# Patient Record
Sex: Female | Born: 1980 | Race: Black or African American | Hispanic: No | Marital: Married | Smoking: Never smoker
Health system: Southern US, Community
[De-identification: ages and names within clinical notes are randomized; demographics above are authoritative.]

## PROBLEM LIST (undated history)

## (undated) ENCOUNTER — Inpatient Hospital Stay (HOSPITAL_COMMUNITY): Payer: Self-pay

## (undated) DIAGNOSIS — A6000 Herpesviral infection of urogenital system, unspecified: Secondary | ICD-10-CM

## (undated) DIAGNOSIS — Z87442 Personal history of urinary calculi: Secondary | ICD-10-CM

## (undated) DIAGNOSIS — I10 Essential (primary) hypertension: Secondary | ICD-10-CM

## (undated) DIAGNOSIS — E119 Type 2 diabetes mellitus without complications: Secondary | ICD-10-CM

## (undated) DIAGNOSIS — M9251 Juvenile osteochondrosis of tibia and fibula, right leg: Secondary | ICD-10-CM

## (undated) DIAGNOSIS — G43909 Migraine, unspecified, not intractable, without status migrainosus: Secondary | ICD-10-CM

## (undated) DIAGNOSIS — M92523 Juvenile osteochondrosis of tibia tubercle, bilateral: Secondary | ICD-10-CM

## (undated) DIAGNOSIS — M9252 Juvenile osteochondrosis of tibia and fibula, left leg: Secondary | ICD-10-CM

## (undated) HISTORY — PX: TONSILLECTOMY: SUR1361

## (undated) HISTORY — DX: Type 2 diabetes mellitus without complications: E11.9

## (undated) HISTORY — PX: WISDOM TOOTH EXTRACTION: SHX21

## (undated) HISTORY — PX: ADENOIDECTOMY: SUR15

---
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
## 2013-02-17 NOTE — L&D Delivery Note (Addendum)
0410: Patient complaining of increased rectal pressure and urge to push.  FHR remained Cat II, however, Dr. Dorris CarnesN. Dillard on standby in building.  Patient instructed on pushing techniques and delivered as below. Delivery call made when infant at +5 station and Dr. Mellody Memos. Davanzo and team present for MSAF.  Delivery Note At 4:21 AM a viable female "[name]" was delivered via Vaginal, Spontaneous Delivery (Presentation: Direct Occiput Anterior).  Upon delivery of head, double nuchal cord noted and infant delivered through via somersault maneuver. Infant with spontaneous cry, but not stimulated due to MSAF.  Immediately placed upon mothers abdomen and cord cut, while RN provided bulb suction.  Infant to warmer for assessment by Dr. Mellody Memos. Davanzo and APGARs scored as: 8, 9; weight 7lbs 4oz.  Cord blood collected and placenta delivered spontaneously and noted to be intact with 3VC upon inspection.   Fundus noted to be boggy to firm with massage, but with constant trickle noted.  Vaginal inspection noted trailing membranes which was collected with rings forcep. However, moderate amount of bleeding noted despite firm fundus.  Uterine exploration performed and yielded ~23850mL of blood clots.  Bleeding scant and infant StS with mother upon provider exit. Ancef ordered for infection prophylaxis.   Anesthesia: Epidural  Episiotomy: None Lacerations: 1st degree;Vaginal;Periurethral Suture Repair: 3.0 vicryl Est. Blood Loss (mL): 450  Mom to postpartum.  Baby to Couplet care / Skin to Skin.  Maleak Brazzel LYNN, CNM, MSN 09/22/2013, 5:18 AM

## 2013-06-06 ENCOUNTER — Other Ambulatory Visit: Payer: Self-pay | Admitting: Obstetrics & Gynecology

## 2013-06-06 DIAGNOSIS — N644 Mastodynia: Secondary | ICD-10-CM

## 2013-06-13 ENCOUNTER — Ambulatory Visit
Admission: RE | Admit: 2013-06-13 | Discharge: 2013-06-13 | Disposition: A | Discharge status: Home / Self Care | Payer: BC Managed Care – PPO | Source: Ambulatory Visit | Attending: Obstetrics & Gynecology | Admitting: Obstetrics & Gynecology

## 2013-06-13 ENCOUNTER — Encounter (INDEPENDENT_AMBULATORY_CARE_PROVIDER_SITE_OTHER): Payer: Self-pay

## 2013-06-13 ENCOUNTER — Other Ambulatory Visit: Payer: Self-pay

## 2013-06-13 DIAGNOSIS — N644 Mastodynia: Secondary | ICD-10-CM

## 2013-09-01 ENCOUNTER — Encounter (HOSPITAL_COMMUNITY): Payer: Self-pay | Admitting: *Deleted

## 2013-09-01 ENCOUNTER — Other Ambulatory Visit: Payer: Self-pay | Admitting: Obstetrics and Gynecology

## 2013-09-01 ENCOUNTER — Inpatient Hospital Stay (HOSPITAL_COMMUNITY)
Admission: AD | Admit: 2013-09-01 | Discharge: 2013-09-01 | Disposition: A | Discharge status: Home / Self Care | Payer: BC Managed Care – PPO | Source: Ambulatory Visit | Attending: Obstetrics and Gynecology | Admitting: Obstetrics and Gynecology

## 2013-09-01 DIAGNOSIS — O9989 Other specified diseases and conditions complicating pregnancy, childbirth and the puerperium: Principal | ICD-10-CM

## 2013-09-01 DIAGNOSIS — R030 Elevated blood-pressure reading, without diagnosis of hypertension: Secondary | ICD-10-CM | POA: Insufficient documentation

## 2013-09-01 DIAGNOSIS — IMO0002 Reserved for concepts with insufficient information to code with codable children: Secondary | ICD-10-CM | POA: Insufficient documentation

## 2013-09-01 DIAGNOSIS — O98519 Other viral diseases complicating pregnancy, unspecified trimester: Secondary | ICD-10-CM | POA: Insufficient documentation

## 2013-09-01 DIAGNOSIS — O99891 Other specified diseases and conditions complicating pregnancy: Secondary | ICD-10-CM | POA: Insufficient documentation

## 2013-09-01 DIAGNOSIS — A6000 Herpesviral infection of urogenital system, unspecified: Secondary | ICD-10-CM | POA: Insufficient documentation

## 2013-09-01 LAB — URINALYSIS, ROUTINE W REFLEX MICROSCOPIC
Bilirubin Urine: NEGATIVE
GLUCOSE, UA: NEGATIVE mg/dL
Hgb urine dipstick: NEGATIVE
Ketones, ur: NEGATIVE mg/dL
Leukocytes, UA: NEGATIVE
Nitrite: NEGATIVE
PH: 6 (ref 5.0–8.0)
Protein, ur: NEGATIVE mg/dL
Specific Gravity, Urine: 1.01 (ref 1.005–1.030)
Urobilinogen, UA: 0.2 mg/dL (ref 0.0–1.0)

## 2013-09-01 LAB — COMPREHENSIVE METABOLIC PANEL
ALK PHOS: 111 U/L (ref 39–117)
ALT: 8 U/L (ref 0–35)
ANION GAP: 12 (ref 5–15)
AST: 11 U/L (ref 0–37)
Albumin: 2.5 g/dL — ABNORMAL LOW (ref 3.5–5.2)
BUN: 6 mg/dL (ref 6–23)
CHLORIDE: 101 meq/L (ref 96–112)
CO2: 23 meq/L (ref 19–32)
Calcium: 8.7 mg/dL (ref 8.4–10.5)
Creatinine, Ser: 0.76 mg/dL (ref 0.50–1.10)
GFR calc Af Amer: >90 mL/min (ref >90)
GLUCOSE: 78 mg/dL (ref 70–99)
POTASSIUM: 4 meq/L (ref 3.7–5.3)
SODIUM: 136 meq/L — AB (ref 137–147)
TOTAL PROTEIN: 5.9 g/dL — AB (ref 6.0–8.3)

## 2013-09-01 LAB — CBC
HEMATOCRIT: 32.1 % — AB (ref 36.0–46.0)
HEMOGLOBIN: 10.1 g/dL — AB (ref 12.0–15.0)
MCH: 26.2 pg (ref 26.0–34.0)
MCHC: 31.5 g/dL (ref 30.0–36.0)
MCV: 83.4 fL (ref 78.0–100.0)
Platelets: 186 10*3/uL (ref 150–400)
RBC: 3.85 MIL/uL — ABNORMAL LOW (ref 3.87–5.11)
RDW: 13.5 % (ref 11.5–15.5)
WBC: 7.4 10*3/uL (ref 4.0–10.5)

## 2013-09-01 LAB — PROTEIN / CREATININE RATIO, URINE
CREATININE, URINE: 94.59 mg/dL
PROTEIN CREATININE RATIO: 0.08 (ref 0.00–0.15)
TOTAL PROTEIN, URINE: 7.2 mg/dL

## 2013-09-01 LAB — LACTATE DEHYDROGENASE: LDH: 199 U/L (ref 94–250)

## 2013-09-01 LAB — URIC ACID: Uric Acid, Serum: 6.4 mg/dL (ref 2.4–7.0)

## 2013-09-01 MED ORDER — VALACYCLOVIR HCL 500 MG PO TABS
500.0000 mg | ORAL_TABLET | Freq: Two times a day (BID) | ORAL | Status: DC
Start: 2013-09-01 — End: 2013-09-24

## 2013-09-01 MED ORDER — VITAFOL-OB PO TABS: 1.0000 | ORAL_TABLET | Freq: Every day | ORAL | Status: DC

## 2013-09-01 NOTE — MAU Note (Addendum)
Pt sent over for PIH eval. Elevated b/p and swelling in legs.

## 2013-09-01 NOTE — Discharge Instructions (Signed)
Back Pain in Pregnancy °Back pain during pregnancy is common. It happens in about half of all pregnancies. It is important for you and your baby that you remain active during your pregnancy. If you feel that back pain is not allowing you to remain active or sleep well, it is time to see your caregiver. Back pain may be caused by several factors related to changes during your pregnancy. Fortunately, unless you had trouble with your back before your pregnancy, the pain is likely to get better after you deliver. °Low back pain usually occurs between the fifth and seventh months of pregnancy. It can, however, happen in the first couple months. Factors that increase the risk of back problems include:  °· Previous back problems. °· Injury to your back. °· Having twins or multiple births. °· A chronic cough. °· Stress. °· Job-related repetitive motions. °· Muscle or spinal disease in the back. °· Family history of back problems, ruptured (herniated) discs, or osteoporosis. °· Depression, anxiety, and panic attacks. °CAUSES  °· When you are pregnant, your body produces a hormone called relaxin. This hormone makes the ligaments connecting the low back and pubic bones more flexible. This flexibility allows the baby to be delivered more easily. When your ligaments are loose, your muscles need to work harder to support your back. Soreness in your back can come from tired muscles. Soreness can also come from back tissues that are irritated since they are receiving less support. °· As the baby grows, it puts pressure on the nerves and blood vessels in your pelvis. This can cause back pain. °· As the baby grows and gets heavier during pregnancy, the uterus pushes the stomach muscles forward and changes your center of gravity. This makes your back muscles work harder to maintain good posture. °SYMPTOMS  °Lumbar pain during pregnancy °Lumbar pain during pregnancy usually occurs at or above the waist in the center of the back. There  may be pain and numbness that radiates into your leg or foot. This is similar to low back pain experienced by non-pregnant women. It usually increases with sitting for long periods of time, standing, or repetitive lifting. Tenderness may also be present in the muscles along your upper back. °Posterior pelvic pain during pregnancy °Pain in the back of the pelvis is more common than lumbar pain in pregnancy. It is a deep pain felt in your side at the waistline, or across the tailbone (sacrum), or in both places. You may have pain on one or both sides. This pain can also go into the buttocks and backs of the upper thighs. Pubic and groin pain may also be present. The pain does not quickly resolve with rest, and morning stiffness may also be present. °Pelvic pain during pregnancy can be brought on by most activities. A high level of fitness before and during pregnancy may or may not prevent this problem. Labor pain is usually 1 to 2 minutes apart, lasts for about 1 minute, and involves a bearing down feeling or pressure in your pelvis. However, if you are at term with the pregnancy, constant low back pain can be the beginning of early labor, and you should be aware of this. °DIAGNOSIS  °X-rays of the back should not be done during the first 12 to 14 weeks of the pregnancy and only when absolutely necessary during the rest of the pregnancy. MRIs do not give off radiation and are safe during pregnancy. MRIs also should only be done when absolutely necessary. °HOME CARE INSTRUCTIONS °· Exercise   as directed by your caregiver. Exercise is the most effective way to prevent or manage back pain. If you have a back problem, it is especially important to avoid sports that require sudden body movements. Swimming and walking are great activities. °· Do not stand in one place for long periods of time. °· Do not wear high heels. °· Sit in chairs with good posture. Use a pillow on your lower back if necessary. Make sure your head  rests over your shoulders and is not hanging forward. °· Try sleeping on your side, preferably the left side, with a pillow or two between your legs. If you are sore after a night's rest, your bed may be too soft. Try placing a board between your mattress and box spring. °· Listen to your body when lifting. If you are experiencing pain, ask for help or try bending your knees more so you can use your leg muscles rather than your back muscles. Squat down when picking up something from the floor. Do not bend over. °· Eat a healthy diet. Try to gain weight within your caregiver's recommendations. °· Use heat or cold packs 3 to 4 times a day for 15 minutes to help with the pain. °· Only take over-the-counter or prescription medicines for pain, discomfort, or fever as directed by your caregiver. °Sudden (acute) back pain °· Use bed rest for only the most extreme, acute episodes of back pain. Prolonged bed rest over 48 hours will aggravate your condition. °· Ice is very effective for acute conditions. °¨ Put ice in a plastic bag. °¨ Place a towel between your skin and the bag. °¨ Leave the ice on for 10 to 20 minutes every 2 hours, or as needed. °· Using heat packs for 30 minutes prior to activities is also helpful. °Continued back pain °See your caregiver if you have continued problems. Your caregiver can help or refer you for appropriate physical therapy. With conditioning, most back problems can be avoided. Sometimes, a more serious issue may be the cause of back pain. You should be seen right away if new problems seem to be developing. Your caregiver may recommend: °· A maternity girdle. °· An elastic sling. °· A back brace. °· A massage therapist or acupuncture. °SEEK MEDICAL CARE IF:  °· You are not able to do most of your daily activities, even when taking the pain medicine you were given. °· You need a referral to a physical therapist or chiropractor. °· You want to try acupuncture. °SEEK IMMEDIATE MEDICAL CARE  IF: °· You develop numbness, tingling, weakness, or problems with the use of your arms or legs. °· You develop severe back pain that is no longer relieved with medicines. °· You have a sudden change in bowel or bladder control. °· You have increasing pain in other areas of the body. °· You develop shortness of breath, dizziness, or fainting. °· You develop nausea, vomiting, or sweating. °· You have back pain which is similar to labor pains. °· You have back pain along with your water breaking or vaginal bleeding. °· You have back pain or numbness that travels down your leg. °· Your back pain developed after you fell. °· You develop pain on one side of your back. You may have a kidney stone. °· You see blood in your urine. You may have a bladder infection or kidney stone. °· You have back pain with blisters. You may have shingles. °Back pain is fairly common during pregnancy but should not be accepted as just part of   the process. Back pain should always be treated as soon as possible. This will make your pregnancy as pleasant as possible. Document Released: 05/14/2005 Document Revised: 04/28/2011 Document Reviewed: 06/25/2010 [facility] Patient Information 2015 [place], [place]. This information is not intended to replace advice given to you by your health care provider. Make sure you discuss any questions you have with your health care provider. Abdominal Pain During Pregnancy Abdominal pain is common in pregnancy. Most of the time, it does not cause harm. There are many causes of abdominal pain. Some causes are more serious than others. Some of the causes of abdominal pain in pregnancy are easily diagnosed. Occasionally, the diagnosis takes time to understand. Other times, the cause is not determined. Abdominal pain can be a sign that something is very wrong with the pregnancy, or the pain may have nothing to do with the pregnancy at all. For this reason, always tell your health care provider if you have any  abdominal discomfort. HOME CARE INSTRUCTIONS  Monitor your abdominal pain for any changes. The following actions may help to alleviate any discomfort you are experiencing:  Do not have sexual intercourse or put anything in your vagina until your symptoms go away completely.  Get plenty of rest until your pain improves.  Drink clear fluids if you feel nauseous. Avoid solid food as long as you are uncomfortable or nauseous.  Only take over-the-counter or prescription medicine as directed by your health care provider.  Keep all follow-up appointments with your health care provider. SEEK IMMEDIATE MEDICAL CARE IF:  You are bleeding, leaking fluid, or passing tissue from the vagina.  You have increasing pain or cramping.  You have persistent vomiting.  You have painful or bloody urination.  You have a fever.  You notice a decrease in your baby's movements.  You have extreme weakness or feel faint.  You have shortness of breath, with or without abdominal pain.  You develop a severe headache with abdominal pain.  You have abnormal vaginal discharge with abdominal pain.  You have persistent diarrhea.  You have abdominal pain that continues even after rest, or gets worse. MAKE SURE YOU:   Understand these instructions.  Will watch your condition.  Will get help right away if you are not doing well or get worse. Document Released: 02/03/2005 Document Revised: 11/24/2012 Document Reviewed: 09/02/2012 [facility] Patient Information 2015 [place], [place]. This information is not intended to replace advice given to you by your health care provider. Make sure you discuss any questions you have with your health care provider.

## 2013-09-01 NOTE — MAU Provider Note (Signed)
History   Patient is a 33 y.o. G1P0 who presents from the office after a routine gyn appt, in which she was discovered to be 37.[redacted]wks pregnant.  Patient states that she has been having abdominal and back pain which she has been contributing to gas.  Patient states that she was seen at another office earlier this month and was told that her pains was related to her increased weight.  Patient states that she has been feeling "gas" for the last two weeks, which she now states is fetal movement.  Patient denies LoF, VB, and cramping/contractions.  Patient sent from office due to increased blood pressure, which she contributes to recent discovery of pregnancy.  Patient denies headache, visual disturbance, numbness/tingling, epigastric pain, and SOB.  Patient reports some leg edema, but states this just started about 2 weeks ago.  Patient denies personal history of blood pressure related issues.   There are no active problems to display for this patient.   Chief Complaint  Patient presents with  . Hypertension   HPI  OB History   Grav Para Term Preterm Abortions TAB SAB Ect Mult Living   2 0   1     0      Past Medical History  Diagnosis Date  . Medical history non-contributory     Past Surgical History  Procedure Laterality Date  . Tonsillectomy    . Adenoidectomy      History reviewed. No pertinent family history.  History  Substance Use Topics  . Smoking status: Never Smoker   . Smokeless tobacco: Not on file  . Alcohol Use: Yes     Comment: ocassional    Allergies: No Known Allergies  Prescriptions prior to admission  Medication Sig Dispense Refill  . aspirin-acetaminophen-caffeine (EXCEDRIN MIGRAINE) 250-250-65 MG per tablet Take 2 tablets by mouth every 6 (six) hours as needed for headache.        ROS  See HPI Above Physical Exam   Blood pressure 129/84, pulse 66, resp. rate 18, last menstrual period 12/14/2012. Filed Vitals:   09/01/13 1919 09/01/13 1933 09/01/13  2014 09/01/13 2018  BP: 129/84 141/80 154/85 135/85  Pulse: 66 66 64 62  Resp:         Results for orders placed during the hospital encounter of 09/01/13 (from the past 24 hour(s))  URINALYSIS, ROUTINE W REFLEX MICROSCOPIC     Status: None   Collection Time    09/01/13  6:25 PM      Result Value Ref Range   Color, Urine YELLOW  YELLOW   APPearance CLEAR  CLEAR   Specific Gravity, Urine 1.010  1.005 - 1.030   pH 6.0  5.0 - 8.0   Glucose, UA NEGATIVE  NEGATIVE mg/dL   Hgb urine dipstick NEGATIVE  NEGATIVE   Bilirubin Urine NEGATIVE  NEGATIVE   Ketones, ur NEGATIVE  NEGATIVE mg/dL   Protein, ur NEGATIVE  NEGATIVE mg/dL   Urobilinogen, UA 0.2  0.0 - 1.0 mg/dL   Nitrite NEGATIVE  NEGATIVE   Leukocytes, UA NEGATIVE  NEGATIVE  PROTEIN / CREATININE RATIO, URINE     Status: None   Collection Time    09/01/13  6:25 PM      Result Value Ref Range   Creatinine, Urine 94.59     Total Protein, Urine 7.2     PROTEIN CREATININE RATIO 0.08  0.00 - 0.15  CBC     Status: Abnormal   Collection Time    09/01/13  7:05 PM      Result Value Ref Range   WBC 7.4  4.0 - 10.5 K/uL   RBC 3.85 (*) 3.87 - 5.11 MIL/uL   Hemoglobin 10.1 (*) 12.0 - 15.0 g/dL   HCT 16.1 (*) 09.6 - 04.5 %   MCV 83.4  78.0 - 100.0 fL   MCH 26.2  26.0 - 34.0 pg   MCHC 31.5  30.0 - 36.0 g/dL   RDW 40.9  81.1 - 91.4 %   Platelets 186  150 - 400 K/uL  COMPREHENSIVE METABOLIC PANEL     Status: Abnormal   Collection Time    09/01/13  7:05 PM      Result Value Ref Range   Sodium 136 (*) 137 - 147 mEq/L   Potassium 4.0  3.7 - 5.3 mEq/L   Chloride 101  96 - 112 mEq/L   CO2 23  19 - 32 mEq/L   Glucose, Bld 78  70 - 99 mg/dL   BUN 6  6 - 23 mg/dL   Creatinine, Ser 7.82  0.50 - 1.10 mg/dL   Calcium 8.7  8.4 - 95.6 mg/dL   Total Protein 5.9 (*) 6.0 - 8.3 g/dL   Albumin 2.5 (*) 3.5 - 5.2 g/dL   AST 11  0 - 37 U/L   ALT 8  0 - 35 U/L   Alkaline Phosphatase 111  39 - 117 U/L   Total Bilirubin <0.2 (*) 0.3 - 1.2 mg/dL   GFR  calc non Af Amer >90  >90 mL/min   GFR calc Af Amer >90  >90 mL/min   Anion gap 12  5 - 15  LACTATE DEHYDROGENASE     Status: None   Collection Time    09/01/13  7:05 PM      Result Value Ref Range   LDH 199  94 - 250 U/L  URIC ACID     Status: None   Collection Time    09/01/13  7:05 PM      Result Value Ref Range   Uric Acid, Serum 6.4  2.4 - 7.0 mg/dL    Physical Exam  Constitutional: She is oriented to person, place, and time. She appears well-developed and well-nourished. No distress.  +2 Bilateral Pitting Edema in LE Some facial edema noted, particularly in eyelids.  Cardiovascular: Normal rate, regular rhythm and normal heart sounds.   Respiratory: Effort normal and breath sounds normal.  GI: Soft. Bowel sounds are normal. There is no tenderness. There is no guarding and no CVA tenderness.  Neurological: She is alert and oriented to person, place, and time.  Skin: Skin is warm and dry.  Psychiatric: She has a normal mood and affect. Her speech is normal and behavior is normal. Thought content normal.  Appears to be coping well with discovery of pregnancy in 3rd trimester.    FHR: 125 bpm, Mod Var, -Decels, +Accels UC: None noted or palpated--Fundus soft, NT ED Course  Assessment: IUP at 37.2wks Elevated BP Edema H/O HSV  Plan: -PIH labs ordered-results pending -Will add ob labs, HbSAG, RPR, and HgB electrophoresis, to current orders to avoid future needlesticks -B/P Q  Follow Up (2020) -Labs normal -BP Stable -Discussed POC regarding PNC-Appt scheduled for 7/21 -Will start Valtrex prophylaxis-Rx sent -Will start PNV-RX sent -Discontinue usage of excedrin -Discussed who and when to call for pregnancy related concerns/issues -Call if you have any questions or concerns prior to your next visit.   Lovie Agresta LYNN CNM, MSN  09/01/2013 8:09 PM

## 2013-09-02 LAB — HEPATITIS B SURFACE ANTIGEN: HEP B S AG: NEGATIVE

## 2013-09-02 LAB — SICKLE CELL SCREEN: Sickle Cell Screen: NEGATIVE

## 2013-09-02 LAB — RPR

## 2013-09-05 LAB — OB RESULTS CONSOLE GC/CHLAMYDIA
CHLAMYDIA, DNA PROBE: NEGATIVE
Gonorrhea: NEGATIVE

## 2013-09-05 LAB — OB RESULTS CONSOLE ABO/RH: RH Type: POSITIVE

## 2013-09-05 LAB — OB RESULTS CONSOLE ANTIBODY SCREEN: ANTIBODY SCREEN: NEGATIVE

## 2013-09-05 LAB — OB RESULTS CONSOLE RPR: RPR: NONREACTIVE

## 2013-09-05 LAB — OB RESULTS CONSOLE GBS: STREP GROUP B AG: NEGATIVE

## 2013-09-05 LAB — OB RESULTS CONSOLE HIV ANTIBODY (ROUTINE TESTING): HIV: NONREACTIVE

## 2013-09-05 LAB — OB RESULTS CONSOLE RUBELLA ANTIBODY, IGM: Rubella: NON-IMMUNE/NOT IMMUNE

## 2013-09-12 ENCOUNTER — Institutional Professional Consult (permissible substitution): Payer: Self-pay | Admitting: Pediatrics

## 2013-09-14 ENCOUNTER — Telehealth (HOSPITAL_COMMUNITY): Payer: Self-pay | Admitting: *Deleted

## 2013-09-14 NOTE — Telephone Encounter (Signed)
Preadmission screen  

## 2013-09-16 ENCOUNTER — Encounter (HOSPITAL_COMMUNITY): Payer: Self-pay | Admitting: Obstetrics and Gynecology

## 2013-09-16 DIAGNOSIS — O139 Gestational [pregnancy-induced] hypertension without significant proteinuria, unspecified trimester: Secondary | ICD-10-CM | POA: Diagnosis present

## 2013-09-16 DIAGNOSIS — O0933 Supervision of pregnancy with insufficient antenatal care, third trimester: Secondary | ICD-10-CM

## 2013-09-16 DIAGNOSIS — B009 Herpesviral infection, unspecified: Secondary | ICD-10-CM

## 2013-09-16 NOTE — H&P (Incomplete)
Miranda Luna is a 33 y.o. female, G2P0010 at 3 weeks, presenting for induction on 09/20/13 due to gestational hypertension, with patient late to dx of pregnancy at 107 weeks.  She is on Labetalol 100 mg po BID as of 09/08/13.  She denies HSV prodrome or recent outbreaks--on Valtrex suppression.  Patient Active Problem List   Diagnosis Date Noted  . Late prenatal care in third trimester--36 weeks 09/16/2013  . Gestational hypertension--on Labetalol 09/16/2013  . 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 Severe obesity (BMI >= 40) 09/16/2013    History of present pregnancy: Patient entered care at 36 weeks--she presented as a new GYN patient with c/o amenorrhea, bloating, and pelvic pain, but was dx with late gestation pregnancy.   EDC of 09/26/13 was established by  Anatomy scan: 36 5/7 weeks, with EFW 3161 gm, 7 lbs, AFI 12.20, 40%ile, limited anatomy due to advanced gestation, anterior placenta, cervix closed.   Additional [place] evaluations:   38 weeks for growth/BPP:  Vtx, EFW 3303, 7+4, 74$ile, vtx, AFI 15.7, 60%ile, BPP 8/8.  Significant prenatal events:  Late to care with newly dx pregnancy at 36 weeks.  Elevated BP noted at initial visit, with patient sent to MAU for [facility] evaluation.  All labs and PCR WNL.  Rx'd with Labetalol 100 mg po BID on 09/08/13.  Panorama testing WNL, with female fetus.  Apparently did not have glucola testing, since I see no results in chart or evidence of testing. Last evaluations:  (1) 09/12/13, with BP 160/100, 142/80. [place] for growth and BPP WNL.  1+ edema noted. (2) 09/15/13 for BP check = 158/78.  Has f/u appt for BPP and visit on 09/19/13.   OB History   Grav Para Term Preterm Abortions TAB SAB Ect Mult Living   2 0   1     0    2011--TAB at [redacted] weeks gestation  Past Medical History  Diagnosis Date  . Migraines   . Genital HSV    Past Surgical History  Procedure Laterality Date  . Tonsillectomy    . Adenoidectomy     Family History: family history  includes Diabetes in her maternal grandmother; Heart disease in her mother; Hypertension in her maternal grandmother and mother.  Social History:  reports that she has never smoked. She does not have any smokeless tobacco history on file. She reports that she drinks alcohol. She reports that she does not use illicit drugs.   Prenatal Transfer Tool  Maternal Diabetes: Unknown--no testing done Genetic Screening: Normal Panorama, female fetus Maternal Ultrasounds/Referrals: Normal Fetal Ultrasounds or other Referrals:  None Maternal Substance Abuse:  No Significant Maternal Medications:  Meds include: Other: Labetalol 100 mg po BID Significant Maternal Lab Results: Lab values include: Group B Strep negative    ROS:  Occasional contractions, +FM  No Known Allergies     Last menstrual period 12/14/2012.  Chest clear Heart RRR without murmur Abd gravid, NT, FH *** Pelvic: ***, no evidence HSV lesions or prodrome Ext: ***  FHR: *** UCs:  ***  Prenatal labs: ABO, Rh:  A+ Antibody:  Neg Rubella:   Non-immune RPR: NON REAC (07/16 1905)  HBsAg: NEGATIVE (07/16 1905)  HIV:   NR GBS: Negative (07/20 0000)Negative Sickle cell/Hgb electrophoresis:  AA Pap:  WNL 09/01/13 GC:  Negative 09/01/13 Chlamydia: Negative 09/01/13 Genetic screenings:  Panorama WNL, female   Glucola:  No testing recorded Other:   PIH labs and PCR done 09/01/13, WNL Hgb 10.4 on 09/05/13, 10.4  on 09/08/13       Assessment/Plan: IUP at [redacted] weeks Gestational hypertension--on Labetalol 100 mg BID Late prenatal care Hx HSV GBS negative Rubella non-immune   [facility] Dr. Su Hiltoberts Routine [facility] labor orders PIH labs and PCR on admission. Continue Labetalol 100 mg po BID Labetalol IV prn for BP elevation per parameters Plan induction via *** Pain medication prn Social work consult pp due to late dx of pregnancy.  Miranda Luna, [place], [place] 09/16/2013, 4:19 AM

## 2013-09-20 ENCOUNTER — Inpatient Hospital Stay (HOSPITAL_COMMUNITY)
Admission: RE | Admit: 2013-09-20 | Discharge: 2013-09-24 | DRG: 774 | Disposition: A | Discharge status: Home / Self Care | Payer: BC Managed Care – PPO | Source: Ambulatory Visit | Attending: Obstetrics and Gynecology | Admitting: Obstetrics and Gynecology

## 2013-09-20 ENCOUNTER — Inpatient Hospital Stay (HOSPITAL_COMMUNITY)
Admission: AD | Admit: 2013-09-20 | Payer: BC Managed Care – PPO | Source: Ambulatory Visit | Admitting: Obstetrics and Gynecology

## 2013-09-20 ENCOUNTER — Encounter (HOSPITAL_COMMUNITY): Payer: Self-pay

## 2013-09-20 DIAGNOSIS — Z8669 Personal history of other diseases of the nervous system and sense organs: Secondary | ICD-10-CM

## 2013-09-20 DIAGNOSIS — D649 Anemia, unspecified: Secondary | ICD-10-CM | POA: Diagnosis present

## 2013-09-20 DIAGNOSIS — O9903 Anemia complicating the puerperium: Secondary | ICD-10-CM | POA: Diagnosis present

## 2013-09-20 DIAGNOSIS — G43909 Migraine, unspecified, not intractable, without status migrainosus: Secondary | ICD-10-CM | POA: Diagnosis present

## 2013-09-20 DIAGNOSIS — Z8249 Family history of ischemic heart disease and other diseases of the circulatory system: Secondary | ICD-10-CM

## 2013-09-20 DIAGNOSIS — Z6841 Body Mass Index (BMI) 40.0 and over, adult: Secondary | ICD-10-CM

## 2013-09-20 DIAGNOSIS — O98519 Other viral diseases complicating pregnancy, unspecified trimester: Secondary | ICD-10-CM | POA: Diagnosis present

## 2013-09-20 DIAGNOSIS — E669 Obesity, unspecified: Secondary | ICD-10-CM | POA: Diagnosis present

## 2013-09-20 DIAGNOSIS — O0930 Supervision of pregnancy with insufficient antenatal care, unspecified trimester: Secondary | ICD-10-CM

## 2013-09-20 DIAGNOSIS — A6000 Herpesviral infection of urogenital system, unspecified: Secondary | ICD-10-CM | POA: Diagnosis present

## 2013-09-20 DIAGNOSIS — Z833 Family history of diabetes mellitus: Secondary | ICD-10-CM

## 2013-09-20 DIAGNOSIS — O139 Gestational [pregnancy-induced] hypertension without significant proteinuria, unspecified trimester: Principal | ICD-10-CM | POA: Diagnosis present

## 2013-09-20 DIAGNOSIS — O99214 Obesity complicating childbirth: Secondary | ICD-10-CM

## 2013-09-20 HISTORY — DX: Herpesviral infection of urogenital system, unspecified: A60.00

## 2013-09-20 HISTORY — DX: Migraine, unspecified, not intractable, without status migrainosus: G43.909

## 2013-09-20 MED ORDER — LABETALOL HCL 5 MG/ML IV SOLN
10.0000 mg | INTRAVENOUS | Status: DC | PRN
  Administered 2013-09-20 – 2013-09-21 (×3): 10 mg via INTRAVENOUS
  Administered 2013-09-21: 20 mg via INTRAVENOUS
  Filled 2013-09-20 (×3): qty 4

## 2013-09-20 MED ORDER — BUTORPHANOL TARTRATE 1 MG/ML IJ SOLN: 1.0000 mg | INTRAMUSCULAR | Status: DC | PRN

## 2013-09-20 MED ORDER — OXYTOCIN 40 UNITS IN LACTATED RINGERS INFUSION - SIMPLE MED
1.0000 m[IU]/min | INTRAVENOUS | Status: DC
  Filled 2013-09-20: qty 1000

## 2013-09-20 MED ORDER — LIDOCAINE HCL (PF) 1 % IJ SOLN
30.0000 mL | INTRAMUSCULAR | Status: AC | PRN
  Administered 2013-09-22 (×2): 5 mL via SUBCUTANEOUS

## 2013-09-20 MED ORDER — OXYCODONE-ACETAMINOPHEN 5-325 MG PO TABS: 1.0000 | ORAL_TABLET | ORAL | Status: DC | PRN

## 2013-09-20 MED ORDER — ACETAMINOPHEN 325 MG PO TABS: 650.0000 mg | ORAL_TABLET | ORAL | Status: DC | PRN

## 2013-09-20 MED ORDER — OXYTOCIN BOLUS FROM INFUSION
500.0000 mL | INTRAVENOUS | Status: DC
  Administered 2013-09-22: 500 mL via INTRAVENOUS

## 2013-09-20 MED ORDER — OXYTOCIN 40 UNITS IN LACTATED RINGERS INFUSION - SIMPLE MED
62.5000 mL/h | INTRAVENOUS | Status: DC
  Filled 2013-09-20: qty 1000

## 2013-09-20 MED ORDER — TERBUTALINE SULFATE 1 MG/ML IJ SOLN: 0.2500 mg | Freq: Once | INTRAMUSCULAR | Status: AC | PRN

## 2013-09-20 MED ORDER — ZOLPIDEM TARTRATE 5 MG PO TABS: 5.0000 mg | ORAL_TABLET | Freq: Every evening | ORAL | Status: DC | PRN

## 2013-09-20 MED ORDER — FLEET ENEMA 7-19 GM/118ML RE ENEM: 1.0000 | ENEMA | RECTAL | Status: DC | PRN

## 2013-09-20 MED ORDER — IBUPROFEN 600 MG PO TABS: 600.0000 mg | ORAL_TABLET | Freq: Four times a day (QID) | ORAL | Status: DC | PRN

## 2013-09-20 MED ORDER — CITRIC ACID-SODIUM CITRATE 334-500 MG/5ML PO SOLN: 30.0000 mL | ORAL | Status: DC | PRN

## 2013-09-20 MED ORDER — MISOPROSTOL 25 MCG QUARTER TABLET
25.0000 ug | ORAL_TABLET | ORAL | Status: DC | PRN
  Administered 2013-09-20 – 2013-09-21 (×4): 25 ug via VAGINAL
  Filled 2013-09-20 (×4): qty 0.25
  Filled 2013-09-20: qty 1
  Filled 2013-09-20: qty 0.25

## 2013-09-20 MED ORDER — LACTATED RINGERS IV SOLN
INTRAVENOUS | Status: DC
  Administered 2013-09-20 – 2013-09-22 (×3): via INTRAVENOUS

## 2013-09-20 MED ORDER — ONDANSETRON HCL 4 MG/2ML IJ SOLN: 4.0000 mg | Freq: Four times a day (QID) | INTRAMUSCULAR | Status: DC | PRN

## 2013-09-20 MED ORDER — LACTATED RINGERS IV SOLN
500.0000 mL | INTRAVENOUS | Status: DC | PRN
  Administered 2013-09-22: 500 mL via INTRAVENOUS

## 2013-09-20 NOTE — H&P (Signed)
Miranda Luna is a 33 y.o. female, G2P0010 at 8239 weeks, presenting for induction on 09/20/13 due to gestational hypertension, with patient late to dx of pregnancy at 6936 weeks.  She is on Labetalol 100 mg po BID as of 09/08/13.  She denies HSV prodrome or recent outbreaks--on Valtrex suppression.  No hx of HTN.  Patient Active Problem List   Diagnosis Date Noted  . Late prenatal care in third trimester--36 weeks 09/16/2013  . Gestational hypertension--on Labetalol 09/16/2013  . 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 Severe obesity (BMI >= 40) 09/16/2013    History of present pregnancy: Patient entered care at 36 weeks--she presented as a new GYN patient with c/o amenorrhea, bloating, and pelvic pain, but was dx with late gestation pregnancy.   EDC of 09/26/13 was established by  Anatomy scan: 36 5/7 weeks, with EFW 3161 gm, 7 lbs, AFI 12.20, 40%ile, limited anatomy due to advanced gestation, anterior placenta, cervix closed.   Additional US evaluations:   38 weeks for growth/BPP:  Vtx, EFW 3303, 7+4, 74$ile, vtx, AFI 15.7, 60%ile, BPP 8/8.  Significant prenatal events:  Late to care with newly dx pregnancy at 36 weeks.  Elevated BP noted at initial visit, with [facility]  All labs and PCR WNL.  Rx'd with Labetalol 100 mg po BID on 09/08/13.  Panorama testing WNL, with female fetus.  Apparently did not have glucola testing, since I see no results in chart or evidence of testing.  Evaluation 09/12/13, with BP 160/100, 142/80. US for growth and BPP WNL.  1+ edema noted. 09/15/13 for BP check = 158/78.   Last evaluations: 09/19/13--Cervix 1 cm, 50%, vtx, -3.  BP 160/100, 160/100, 1+ edema.  Denied any PIH sx.  Labetalol 100 mg po BID.  OB History   Grav Para Term Preterm Abortions TAB SAB Ect Mult Living   2 0   1     0    2011--TAB at [redacted] weeks gestation  Past Medical History  Diagnosis Date  . Migraines   . Genital HSV    Past Surgical History  Procedure  Laterality Date  . Tonsillectomy    . Adenoidectomy     Family History: family history includes Diabetes in her maternal grandmother; Heart disease in her mother; Hypertension in her maternal grandmother and mother.  Social History:  reports that she has never smoked. She does not have any smokeless tobacco history on file. She reports that she drinks alcohol. She reports that she does not use illicit drugs.  Patient is married to the FOB, Girtha RmMaurie Cavan, who is involved and supportive.  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     Prenatal Transfer Tool  Maternal Diabetes: Unknown--no testing done Genetic Screening: Normal Panorama, female fetus Maternal Ultrasounds/Referrals: Normal Fetal Ultrasounds or other Referrals:  None Maternal Substance Abuse:  No Significant Maternal Medications:  Meds include: Other: Labetalol 100 mg po BID Significant Maternal Lab Results: Lab values include: Group B Strep negative    ROS:  Occasional contractions, +FM  No Known Allergies   Dilation: 1 Effacement (%): 50 Station: -2 Exam by:: VEmilee Hero. Kimya Mccahill  CNM Blood pressure 175/101, pulse 81, temperature 98.2 F (36.8 C), temperature source Oral, resp. rate 18, height 5\' 9"  (1.753 m), weight 274 lb (124.286 kg), last menstrual period 12/14/2012.    Chest clear Heart RRR without murmur Abd gravid, NT, FH 38 cm Pelvic: 1  cm, 50%, vtx, -2, cervix soft, no evidence HSV lesions or prodrome Ext: DTR 1+, no clonus, 1+ edema in LE  FHR: Category 2 initially, with apparent sleep cycle, now Category 1 UCs:  None  Prenatal labs: ABO, Rh: A/Positive/-- (07/20 0000) Antibody: Negative (07/20 0000) Rubella:   Non-immune RPR: Nonreactive (07/20 0000)  HBsAg: NEGATIVE (07/16 1905)  HIV: Non-reactive (07/20 0000) GBS: Negative (07/20 0000) Sickle cell/Hgb electrophoresis:  AA Pap:  WNL 09/01/13 GC:  Negative 09/01/13 Chlamydia: Negative  09/01/13 Genetic screenings:  Panorama WNL, female   Glucola:  No testing recorded Other:   PIH labs and PCR done 09/01/13, WNL Hgb 10.4 on 09/05/13, 10.4 on 09/08/13       Assessment/Plan: IUP at [redacted] weeks Gestational hypertension--on Labetalol 100 mg BID Late prenatal care Hx HSV GBS negative Rubella non-immune   Plan: Admit to [facility] per consult with Dr. Richardson Dopp. Routine [facility] labor orders PIH labs and PCR on admission. Continue Labetalol 100 mg po BID Labetalol IV prn for BP elevation per parameters--if no response, will change to Hydralazine. Plan cervical ripening with Cytotech, then pitocin prn. Pain medication prn Social work consult pp due to late dx of pregnancy. Reviewed plan of care with patient and husband, including possible need for serial induction, further intervention, need for cesarean delivery, additional BP treatment.  They seem to understand these issues and are willing to proceed.  Tayanna Talford, VICKICNM, [place] 09/21/2013, 1:03 AM  Addendum:  Filed Vitals:   09/21/13 0016  BP: 175/101  Pulse: 81  Temp: 98.2 F (36.8 C)  Resp: 18   Filed Vitals:   09/20/13 2331 09/20/13 2346 09/21/13 0001 09/21/13 0016  BP: 177/111 168/102 163/95 175/101  Pulse: 78 72 82 81  Temp:    98.2 F (36.8 C)  TempSrc:    Oral  Resp: 18 18 18 18   Height:      Weight:        Received IV Labetalol after admission:  10 mg at 2347, 10 mg at 0004, 20 mg at 0024  1st dose Cytotech placed at 11:39p  Will reassess for repeat dose at 3:30am. Observe BP--if remains elevated, will give Hydralazine.  Nigel Bridgeman, CNM 09/21/13 1a

## 2013-09-21 DIAGNOSIS — Z8669 Personal history of other diseases of the nervous system and sense organs: Secondary | ICD-10-CM

## 2013-09-21 LAB — COMPREHENSIVE METABOLIC PANEL
ALBUMIN: 2.5 g/dL — AB (ref 3.5–5.2)
ALT: 13 U/L (ref 0–35)
AST: 15 U/L (ref 0–37)
Alkaline Phosphatase: 111 U/L (ref 39–117)
Anion gap: 10 (ref 5–15)
BUN: 8 mg/dL (ref 6–23)
CALCIUM: 9 mg/dL (ref 8.4–10.5)
CHLORIDE: 104 meq/L (ref 96–112)
CO2: 23 meq/L (ref 19–32)
Creatinine, Ser: 0.75 mg/dL (ref 0.50–1.10)
GFR calc Af Amer: >90 mL/min (ref >90)
Glucose, Bld: 86 mg/dL (ref 70–99)
Potassium: 4.3 mEq/L (ref 3.7–5.3)
SODIUM: 137 meq/L (ref 137–147)
Total Bilirubin: <0.2 mg/dL — ABNORMAL LOW (ref 0.3–1.2)
Total Protein: 6.1 g/dL (ref 6.0–8.3)

## 2013-09-21 LAB — CBC
HCT: 34.8 % — ABNORMAL LOW (ref 36.0–46.0)
HCT: 35.2 % — ABNORMAL LOW (ref 36.0–46.0)
Hemoglobin: 11.2 g/dL — ABNORMAL LOW (ref 12.0–15.0)
Hemoglobin: 11.5 g/dL — ABNORMAL LOW (ref 12.0–15.0)
MCH: 27.8 pg (ref 26.0–34.0)
MCH: 27.8 pg (ref 26.0–34.0)
MCHC: 32.2 g/dL (ref 30.0–36.0)
MCHC: 32.7 g/dL (ref 30.0–36.0)
MCV: 85.2 fL (ref 78.0–100.0)
MCV: 86.4 fL (ref 78.0–100.0)
PLATELETS: 191 10*3/uL (ref 150–400)
Platelets: 173 10*3/uL (ref 150–400)
RBC: 4.03 MIL/uL (ref 3.87–5.11)
RBC: 4.13 MIL/uL (ref 3.87–5.11)
RDW: 15.8 % — ABNORMAL HIGH (ref 11.5–15.5)
RDW: 16.2 % — AB (ref 11.5–15.5)
WBC: 9.3 10*3/uL (ref 4.0–10.5)
WBC: 9.3 10*3/uL (ref 4.0–10.5)

## 2013-09-21 LAB — RAPID URINE DRUG SCREEN, HOSP PERFORMED
AMPHETAMINES: NOT DETECTED
BARBITURATES: NOT DETECTED
Benzodiazepines: NOT DETECTED
Cocaine: NOT DETECTED
Opiates: NOT DETECTED
Tetrahydrocannabinol: NOT DETECTED

## 2013-09-21 LAB — ABO/RH: ABO/RH(D): A POS

## 2013-09-21 LAB — TYPE AND SCREEN
ABO/RH(D): A POS
Antibody Screen: NEGATIVE

## 2013-09-21 LAB — PROTEIN / CREATININE RATIO, URINE
Creatinine, Urine: 281.8 mg/dL
Protein Creatinine Ratio: 0.21 — ABNORMAL HIGH (ref 0.00–0.15)
TOTAL PROTEIN, URINE: 59.3 mg/dL

## 2013-09-21 LAB — LACTATE DEHYDROGENASE: LDH: 215 U/L (ref 94–250)

## 2013-09-21 LAB — RPR

## 2013-09-21 LAB — URIC ACID: URIC ACID, SERUM: 7.2 mg/dL — AB (ref 2.4–7.0)

## 2013-09-21 MED ORDER — PHENYLEPHRINE 40 MCG/ML (10ML) SYRINGE FOR IV PUSH (FOR BLOOD PRESSURE SUPPORT)
80.0000 ug | PREFILLED_SYRINGE | INTRAVENOUS | Status: DC | PRN
Start: 2013-09-21 — End: 2013-09-22
  Filled 2013-09-21: qty 2

## 2013-09-21 MED ORDER — EPHEDRINE 5 MG/ML INJ
10.0000 mg | INTRAVENOUS | Status: DC | PRN
Start: 2013-09-21 — End: 2013-09-22
  Filled 2013-09-21: qty 2

## 2013-09-21 MED ORDER — EPHEDRINE 5 MG/ML INJ
10.0000 mg | INTRAVENOUS | Status: DC | PRN
  Filled 2013-09-21: qty 2

## 2013-09-21 MED ORDER — LABETALOL HCL 200 MG PO TABS
400.0000 mg | ORAL_TABLET | Freq: Four times a day (QID) | ORAL | Status: DC
  Filled 2013-09-21 (×3): qty 2

## 2013-09-21 MED ORDER — LABETALOL HCL 200 MG PO TABS
200.0000 mg | ORAL_TABLET | Freq: Two times a day (BID) | ORAL | Status: DC
  Administered 2013-09-21: 200 mg via ORAL
  Filled 2013-09-21 (×3): qty 1

## 2013-09-21 MED ORDER — OXYTOCIN 40 UNITS IN LACTATED RINGERS INFUSION - SIMPLE MED: 1.0000 m[IU]/min | INTRAVENOUS | Status: DC

## 2013-09-21 MED ORDER — TERBUTALINE SULFATE 1 MG/ML IJ SOLN: 0.2500 mg | Freq: Once | INTRAMUSCULAR | Status: AC | PRN

## 2013-09-21 MED ORDER — LABETALOL HCL 200 MG PO TABS
400.0000 mg | ORAL_TABLET | Freq: Four times a day (QID) | ORAL | Status: DC
  Administered 2013-09-22 – 2013-09-23 (×5): 400 mg via ORAL
  Filled 2013-09-21 (×6): qty 2

## 2013-09-21 MED ORDER — DIPHENHYDRAMINE HCL 25 MG PO CAPS
50.0000 mg | ORAL_CAPSULE | Freq: Every evening | ORAL | Status: DC | PRN
  Administered 2013-09-21: 50 mg via ORAL
  Filled 2013-09-21: qty 2

## 2013-09-21 MED ORDER — LABETALOL HCL 200 MG PO TABS
400.0000 mg | ORAL_TABLET | Freq: Four times a day (QID) | ORAL | Status: DC
  Filled 2013-09-21 (×2): qty 2

## 2013-09-21 MED ORDER — HYDRALAZINE HCL 20 MG/ML IJ SOLN: 10.0000 mg | INTRAMUSCULAR | Status: DC | PRN

## 2013-09-21 MED ORDER — LACTATED RINGERS IV SOLN
500.0000 mL | Freq: Once | INTRAVENOUS | Status: AC
  Administered 2013-09-21: 500 mL via INTRAVENOUS

## 2013-09-21 MED ORDER — PHENYLEPHRINE 40 MCG/ML (10ML) SYRINGE FOR IV PUSH (FOR BLOOD PRESSURE SUPPORT)
80.0000 ug | PREFILLED_SYRINGE | INTRAVENOUS | Status: DC | PRN
  Filled 2013-09-21: qty 2
  Filled 2013-09-21: qty 10

## 2013-09-21 MED ORDER — LABETALOL HCL 200 MG PO TABS
400.0000 mg | ORAL_TABLET | Freq: Two times a day (BID) | ORAL | Status: DC
  Administered 2013-09-21: 400 mg via ORAL
  Filled 2013-09-21 (×2): qty 2

## 2013-09-21 MED ORDER — FENTANYL 2.5 MCG/ML BUPIVACAINE 1/10 % EPIDURAL INFUSION (WH - ANES)
14.0000 mL/h | INTRAMUSCULAR | Status: DC | PRN
Start: 2013-09-21 — End: 2013-09-22
  Administered 2013-09-22: 14 mL/h via EPIDURAL
  Filled 2013-09-21: qty 125

## 2013-09-21 MED ORDER — HYDRALAZINE HCL 20 MG/ML IJ SOLN
5.0000 mg | Freq: Once | INTRAMUSCULAR | Status: AC
  Administered 2013-09-21: 5 mg via INTRAVENOUS
  Filled 2013-09-21: qty 1

## 2013-09-21 MED ORDER — LABETALOL HCL 200 MG PO TABS
400.0000 mg | ORAL_TABLET | Freq: Two times a day (BID) | ORAL | Status: DC
  Filled 2013-09-21 (×2): qty 2

## 2013-09-21 MED ORDER — HYDRALAZINE HCL 20 MG/ML IJ SOLN
10.0000 mg | INTRAMUSCULAR | Status: AC | PRN
  Administered 2013-09-21 (×2): 10 mg via INTRAVENOUS
  Filled 2013-09-21 (×3): qty 1

## 2013-09-21 MED ORDER — DIPHENHYDRAMINE HCL 50 MG/ML IJ SOLN: 12.5000 mg | INTRAMUSCULAR | Status: DC | PRN

## 2013-09-21 MED ORDER — LABETALOL HCL 200 MG PO TABS
400.0000 mg | ORAL_TABLET | Freq: Four times a day (QID) | ORAL | Status: DC
  Administered 2013-09-21: 400 mg via ORAL
  Filled 2013-09-21 (×3): qty 2

## 2013-09-21 MED ORDER — OXYTOCIN 40 UNITS IN LACTATED RINGERS INFUSION - SIMPLE MED
1.0000 m[IU]/min | INTRAVENOUS | Status: DC
  Administered 2013-09-21: 1 m[IU]/min via INTRAVENOUS

## 2013-09-21 NOTE — Progress Notes (Signed)
  Subjective: -Patient resting in bed.  Denies HA, blurred vision, double vision, SOB, numbness/tingling, or epigastric pain.  Objective: BP 154/98  Pulse 90  Temp(Src) 98.2 F (36.8 C) (Oral)  Resp 18  Ht 5\' 9"  (1.753 m)  Wt 274 lb (124.286 kg)  BMI 40.44 kg/m2  SpO2 99%  LMP 12/14/2012     FHT:  135 bpm, Mod Var, -Decels, +Accels UC:  Q2-453mins, graphs mild, not palpated.  SVE:  Deferred Membranes: Intact Pitocin: None  Assessment:  IUP at 40.1wks Cat I FT   GBS Negative GHTN  Plan: -Dr. Audree CamelN. Dillard consulted and advised as below. -Discontinue pitocin and restart cytotec-administer throughout the night -Increase labetalol from 400mg  BID, 400mg  QID -Discussed POC with patient, who agrees -Patient to take shower and will insert initial cytotec dosage tonight -Continue other mgmt as ordered  [facility] Center[facility]LY, Miranda Luna,CNM, MSN 09/21/2013, 8:41 PM

## 2013-09-21 NOTE — Progress Notes (Addendum)
Addendum  Nurse called to report BPs 173/97, 172/102, 170/100  Give 5mg  Hydralazine IV Consulted with Dr Su Hiltoberts   FHT: Category 1 CTX: 2-4, moderate

## 2013-09-21 NOTE — Progress Notes (Signed)
Labor Progress  Subjective: Aware of ctx occasionally, non painful.  Denies vb or lof w/+fm  Objective: BP 157/101  Pulse 90  Temp(Src) 98.2 F (36.8 C) (Oral)  Resp 18  Ht 5\' 9"  (1.753 m)  Wt 274 lb (124.286 kg)  BMI 40.44 kg/m2  SpO2 99%  LMP 12/14/2012      FHT: Category 1 CTX:   irregular, occassional Uterus gravid, soft non tender SVE: Dilation: 1  Effacement (%): 50  Station: -2  Exam by:: Rudolfo Brandow, CNM    Extremities: WNL IV antihypertensive over night x5  Assessment:  IUP at 40.1 weeks w/GHTN Membranes: intact Cervix: on favorable Labor progress: IOL  Plan: Continue labor plan Rest/Ambulate Continuous monitoring IV labetalol prn IV Hydralazine prn Cytotec PV dose #3 Consult with Dr Sande Brothersoberts   Miranda Luna, CNM, MSN 09/21/2013. 11:34 PM

## 2013-09-21 NOTE — Progress Notes (Addendum)
Addendum  Nurse called to report pt BPs 166/110, 169/109  Give 10mg  of Hydralazine  Consulted with Dr Su Hiltoberts  FHT: Category 1  CTX: 2-6, moderate

## 2013-09-21 NOTE — Progress Notes (Signed)
Standard, CNM, at bedside. Orders received to administer 400 mg PO labatelol at this time. If blood pressure has not decreased in 45 minutes-1 hour, orders to give 10 mg of IV hydralazine.

## 2013-09-21 NOTE — Progress Notes (Signed)
  Subjective: -Patient s/p shower. Unaware of contractions. Husband remains supportive at bedside.  Objective: BP 154/106  Pulse 90  Temp(Src) 98.2 F (36.8 C) (Oral)  Resp 18  Ht 5\' 9"  (1.753 m)  Wt 274 lb (124.286 kg)  BMI 40.44 kg/m2  SpO2 99%  LMP 12/14/2012     FHT:  130 bpm, Mod Var, -Decels, -Accels UC:   Q2-634min, palpates mild SVE:   1/50/-2 Membranes: Intact Pitocin:None Cytotec inserted w/o difficulty  Assessment:  IUP at 40.1wks Cat I FT  GHTN Cervical Ripening Bishop Score: 5  Plan: -Cytotec placed -Will reassess in 4 hours or prn -Benadryl ordered to promote sleep -Discussed POC with patient who verbalized understanding -Continue other mgmt as ordered  Miranda Luna LYNN,CNM, MSN 09/21/2013, 9:34 PM

## 2013-09-21 NOTE — Progress Notes (Signed)
Subjective: Awakened for exam---sleeping soundly.  Objective: BP 159/90  Pulse 76  Temp(Src) 98.2 F (36.8 C) (Oral)  Resp 18  Ht 5\' 9"  (1.753 m)  Wt 274 lb (124.286 kg)  BMI 40.44 kg/m2  LMP 12/14/2012      Filed Vitals:   09/21/13 0215 09/21/13 0245 09/21/13 0301 09/21/13 0340  BP: 165/91 149/99 160/98 159/90  Pulse: 75 85 83 76  Temp:      TempSrc:      Resp: 18 18    Height:      Weight:       Received IV Hydralazine 10 mg at 0217.   FHT: Category 1 UC:   Occasional, mild SVE:   1 cm, posterior, 50%, vtx, -3 2nd dose Cytotech placed in posterior fornix  Results for orders placed during the hospital encounter of 09/20/13 (from the past 24 hour(s))  CBC     Status: Abnormal   Collection Time    09/20/13 10:20 PM      Result Value Ref Range   WBC 9.3  4.0 - 10.5 K/uL   RBC 4.03  3.87 - 5.11 MIL/uL   Hemoglobin 11.2 (*) 12.0 - 15.0 g/dL   HCT 16.1 (*) 09.6 - 04.5 %   MCV 86.4  78.0 - 100.0 fL   MCH 27.8  26.0 - 34.0 pg   MCHC 32.2  30.0 - 36.0 g/dL   RDW 40.9 (*) 81.1 - 91.4 %   Platelets 173  150 - 400 K/uL  COMPREHENSIVE METABOLIC PANEL     Status: Abnormal   Collection Time    09/20/13 10:20 PM      Result Value Ref Range   Sodium 137  137 - 147 mEq/L   Potassium 4.3  3.7 - 5.3 mEq/L   Chloride 104  96 - 112 mEq/L   CO2 23  19 - 32 mEq/L   Glucose, Bld 86  70 - 99 mg/dL   BUN 8  6 - 23 mg/dL   Creatinine, Ser 7.82  0.50 - 1.10 mg/dL   Calcium 9.0  8.4 - 95.6 mg/dL   Total Protein 6.1  6.0 - 8.3 g/dL   Albumin 2.5 (*) 3.5 - 5.2 g/dL   AST 15  0 - 37 U/L   ALT 13  0 - 35 U/L   Alkaline Phosphatase 111  39 - 117 U/L   Total Bilirubin <0.2 (*) 0.3 - 1.2 mg/dL   GFR calc non Af Amer >90  >90 mL/min   GFR calc Af Amer >90  >90 mL/min   Anion gap 10  5 - 15  LACTATE DEHYDROGENASE     Status: None   Collection Time    09/20/13 10:20 PM      Result Value Ref Range   LDH 215  94 - 250 U/L  URIC ACID     Status: Abnormal   Collection Time   09/20/13 10:20 PM      Result Value Ref Range   Uric Acid, Serum 7.2 (*) 2.4 - 7.0 mg/dL  PROTEIN / CREATININE RATIO, URINE     Status: Abnormal   Collection Time    09/21/13 12:28 AM      Result Value Ref Range   Creatinine, Urine 281.80     Total Protein, Urine 59.3     PROTEIN CREATININE RATIO 0.21 (*) 0.00 - 0.15     Assessment:  IUP at 40 1/[redacted] weeks Gestational hypertension Late to care GBS negative Unfavorable cervix  Plan: Continue cervical ripening with Cytotech. Close observation of BP, treat prn with Hydralazine  Miranda Luna CNM 09/21/2013, 3:42 AM

## 2013-09-21 NOTE — Progress Notes (Signed)
  Subjective: -Patient reporting increase in rectal pressure and SROM.   Objective: BP 157/101  Pulse 90  Temp(Src) 98.2 F (36.8 C) (Oral)  Resp 18  Ht 5\' 9"  (1.753 m)  Wt 274 lb (124.286 kg)  BMI 40.44 kg/m2  SpO2 99%  LMP 12/14/2012     FHT:  130 bpm, Mod Var, -Decels, +Accels UC: None graphed, palpates moderate   SVE:   Dilation: 2 Effacement (%): 80 Station: 0 Exam by:: Sabas SousJ. Tonea Leiphart, CNM Membranes:SROM at 2301 with Moderate Amt MSF Pitocin: None IUPC: Placed   Assessment:  IUP at 40.1wks Cat I FT GHTN SROM   Plan: -IUPC inserted without difficulty -Discussed MSAF and need for NICU to be present at delivery -Okay for epidural -Continue other mgmt as ordered   [facility] HospitalEMLY, Daeron Carreno LYNN,CNM, MSN 09/21/2013, 11:43 PM

## 2013-09-22 ENCOUNTER — Encounter (HOSPITAL_COMMUNITY): Payer: Self-pay

## 2013-09-22 ENCOUNTER — Inpatient Hospital Stay (HOSPITAL_COMMUNITY): Payer: BC Managed Care – PPO | Admitting: Anesthesiology

## 2013-09-22 ENCOUNTER — Encounter (HOSPITAL_COMMUNITY): Payer: BC Managed Care – PPO | Admitting: Anesthesiology

## 2013-09-22 LAB — COMPREHENSIVE METABOLIC PANEL
ALBUMIN: 2.4 g/dL — AB (ref 3.5–5.2)
ALT: 12 U/L (ref 0–35)
AST: 21 U/L (ref 0–37)
Alkaline Phosphatase: 107 U/L (ref 39–117)
Anion gap: 13 (ref 5–15)
BUN: 8 mg/dL (ref 6–23)
CALCIUM: 8.8 mg/dL (ref 8.4–10.5)
CO2: 21 mEq/L (ref 19–32)
Chloride: 104 mEq/L (ref 96–112)
Creatinine, Ser: 0.72 mg/dL (ref 0.50–1.10)
GFR calc non Af Amer: >90 mL/min (ref >90)
Glucose, Bld: 89 mg/dL (ref 70–99)
Potassium: 4.2 mEq/L (ref 3.7–5.3)
SODIUM: 138 meq/L (ref 137–147)
TOTAL PROTEIN: 6.2 g/dL (ref 6.0–8.3)
Total Bilirubin: 0.2 mg/dL — ABNORMAL LOW (ref 0.3–1.2)

## 2013-09-22 LAB — PROTEIN / CREATININE RATIO, URINE
CREATININE, URINE: 257.5 mg/dL
Protein Creatinine Ratio: 0.16 — ABNORMAL HIGH (ref 0.00–0.15)
TOTAL PROTEIN, URINE: 40.3 mg/dL

## 2013-09-22 LAB — LACTATE DEHYDROGENASE: LDH: 311 U/L — ABNORMAL HIGH (ref 94–250)

## 2013-09-22 LAB — URIC ACID: URIC ACID, SERUM: 7.1 mg/dL — AB (ref 2.4–7.0)

## 2013-09-22 MED ORDER — IBUPROFEN 600 MG PO TABS
600.0000 mg | ORAL_TABLET | Freq: Four times a day (QID) | ORAL | Status: DC
  Administered 2013-09-22 – 2013-09-24 (×8): 600 mg via ORAL
  Filled 2013-09-22 (×9): qty 1

## 2013-09-22 MED ORDER — BENZOCAINE-MENTHOL 20-0.5 % EX AERO
1.0000 "application " | INHALATION_SPRAY | CUTANEOUS | Status: DC | PRN
  Filled 2013-09-22: qty 56

## 2013-09-22 MED ORDER — LIDOCAINE HCL (PF) 1 % IJ SOLN
INTRAMUSCULAR | Status: AC
  Filled 2013-09-22: qty 30

## 2013-09-22 MED ORDER — ONDANSETRON HCL 4 MG/2ML IJ SOLN: 4.0000 mg | INTRAMUSCULAR | Status: DC | PRN

## 2013-09-22 MED ORDER — SENNOSIDES-DOCUSATE SODIUM 8.6-50 MG PO TABS
2.0000 | ORAL_TABLET | ORAL | Status: DC
  Administered 2013-09-23: 2 via ORAL
  Filled 2013-09-22 (×2): qty 2

## 2013-09-22 MED ORDER — MEASLES, MUMPS & RUBELLA VAC ~~LOC~~ INJ
0.5000 mL | INJECTION | Freq: Once | SUBCUTANEOUS | Status: AC
  Administered 2013-09-23: 0.5 mL via SUBCUTANEOUS

## 2013-09-22 MED ORDER — DIBUCAINE 1 % RE OINT: 1.0000 "application " | TOPICAL_OINTMENT | RECTAL | Status: DC | PRN

## 2013-09-22 MED ORDER — DIPHENHYDRAMINE HCL 25 MG PO CAPS: 25.0000 mg | ORAL_CAPSULE | Freq: Four times a day (QID) | ORAL | Status: DC | PRN

## 2013-09-22 MED ORDER — CEFAZOLIN SODIUM-DEXTROSE 2-3 GM-% IV SOLR
2.0000 g | Freq: Once | INTRAVENOUS | Status: AC
  Administered 2013-09-22: 2 g via INTRAVENOUS
  Filled 2013-09-22: qty 50

## 2013-09-22 MED ORDER — WITCH HAZEL-GLYCERIN EX PADS: 1.0000 "application " | MEDICATED_PAD | CUTANEOUS | Status: DC | PRN

## 2013-09-22 MED ORDER — LANOLIN HYDROUS EX OINT: TOPICAL_OINTMENT | CUTANEOUS | Status: DC | PRN

## 2013-09-22 MED ORDER — FERROUS SULFATE 325 (65 FE) MG PO TABS
325.0000 mg | ORAL_TABLET | Freq: Two times a day (BID) | ORAL | Status: DC
  Administered 2013-09-22 – 2013-09-24 (×5): 325 mg via ORAL
  Filled 2013-09-22 (×5): qty 1

## 2013-09-22 MED ORDER — PRENATAL MULTIVITAMIN CH
1.0000 | ORAL_TABLET | Freq: Every day | ORAL | Status: DC
  Administered 2013-09-22 – 2013-09-24 (×3): 1 via ORAL
  Filled 2013-09-22 (×3): qty 1

## 2013-09-22 MED ORDER — MEASLES, MUMPS & RUBELLA VAC ~~LOC~~ INJ
0.5000 mL | INJECTION | Freq: Once | SUBCUTANEOUS | Status: DC
  Filled 2013-09-22: qty 0.5

## 2013-09-22 MED ORDER — LACTATED RINGERS IV SOLN
INTRAVENOUS | Status: DC
  Administered 2013-09-22: 03:00:00 via INTRAUTERINE

## 2013-09-22 MED ORDER — TETANUS-DIPHTH-ACELL PERTUSSIS 5-2.5-18.5 LF-MCG/0.5 IM SUSP
0.5000 mL | Freq: Once | INTRAMUSCULAR | Status: AC
  Administered 2013-09-22: 0.5 mL via INTRAMUSCULAR

## 2013-09-22 MED ORDER — SIMETHICONE 80 MG PO CHEW: 80.0000 mg | CHEWABLE_TABLET | ORAL | Status: DC | PRN

## 2013-09-22 MED ORDER — ZOLPIDEM TARTRATE 5 MG PO TABS: 5.0000 mg | ORAL_TABLET | Freq: Every evening | ORAL | Status: DC | PRN

## 2013-09-22 MED ORDER — ONDANSETRON HCL 4 MG PO TABS: 4.0000 mg | ORAL_TABLET | ORAL | Status: DC | PRN

## 2013-09-22 MED ORDER — OXYCODONE-ACETAMINOPHEN 5-325 MG PO TABS: 1.0000 | ORAL_TABLET | ORAL | Status: DC | PRN

## 2013-09-22 NOTE — Lactation Note (Signed)
This note was copied from the chart of Girl Miranda KraftShelby Corter. Lactation Consultation Note  Patient Name: [name] [id number]'s Date: 09/22/2013 Reason for consult: Initial assessment;Other (Comment) (charting for exclusion)   Maternal Data Formula Feeding for Exclusion: Yes Reason for exclusion: Mother's choice to formula and breast feed on admission (mom is now choosing to formula feed only, per RN staff)  Feeding Feeding Type: Formula Nipple Type: Slow - flow  LATCH Score/Interventions                      Lactation Tools Discussed/Used     Consult Status Consult Status: Complete    Lynda RainwaterBryant, Tyce Delcid [place] 09/22/2013, 4:29 PM

## 2013-09-22 NOTE — Progress Notes (Signed)
Addendum  SVE 1/50/-3  Start pitocin at 1mu up by 1 mu

## 2013-09-22 NOTE — Anesthesia Postprocedure Evaluation (Signed)
  Anesthesia Post-op Note  Patient: Miranda KraftShelby Brys  Procedure(s) Performed: * No procedures listed *  Patient Location: Mother/Baby  Anesthesia Type:Epidural  Level of Consciousness: awake  Airway and Oxygen Therapy: Patient Spontanous Breathing  Post-op Pain: mild  Post-op Assessment: Patient's Cardiovascular Status Stable and Respiratory Function Stable  Post-op Vital Signs: stable  Last Vitals:  Filed Vitals:   09/22/13 1240  BP: 151/84  Pulse: 102  Temp: 37.1 C  Resp: 20    Complications: No apparent anesthesia complications

## 2013-09-22 NOTE — Progress Notes (Signed)
  Subjective: - Strip remains non-reassuring. Dr. Audree CamelN. Dillard contacted for review of strip.   Objective: BP 165/107  Pulse 84  Temp(Src) 98.8 F (37.1 C) (Oral)  Resp 18  Ht 5\' 9"  (1.753 m)  Wt 274 lb (124.286 kg)  BMI 40.44 kg/m2  SpO2 98%  LMP 12/14/2012     FHT:  130s bpm, Min Var, + Late Decels, -Accels UC:   MVUs 280mmHg SVE:   Dilation: 6 Effacement (%): 80 Station: 0 Exam by:: Sabas SousJ. Jerilynn Feldmeier, CNM Membranes:SROM Pitocin:None IUPC in place FSE placed 10L via nasal cannula Amnioinfusion at 125mL  Assessment:  IUP at 40.2wks Cat II FT  GHTN Active labor   Plan: -Dr. Audree CamelN. Dillard in route to review strip -Position changes -Continue other mgmt as ordered   [facility] DivisionEMLY, Hermen Mario LYNN,CNM, MSN 09/22/2013, 3:41 AM  0400 Patient 9/90/+2.  Dr. Audree CamelN. Dillard in house Room prepped for delivery

## 2013-09-22 NOTE — Progress Notes (Signed)
  Subjective: -To room after strip reviewed and noted to have recurrent late decels and absent variability. Patient sleeping soundly, but easily awakened with light touch.    Objective: BP 160/93  Pulse 88  Temp(Src) 98.3 F (36.8 C) (Oral)  Resp 18  Ht 5\' 9"  (1.753 m)  Wt 274 lb (124.286 kg)  BMI 40.44 kg/m2  SpO2 98%  LMP 12/14/2012     FHT:  135 bpm, Mod Var, + Late Decels, -Accels UC:   Q1-2 min, MVUs 260mmHg SVE:   Dilation: 4.5 Effacement (%): 80 Station: 0 Exam by:: Sabas SousJ. Myeisha Kruser, CNM Membranes:SROM Pitocin: None  Assessment:  IUP at 40.2wks Cat III FT  GHTN Active Labor  Plan: -Position change to left lateral -LR Bolus of ~64500mL -Amnioinfusion: 250mL bolus, 125mL continuous -Cat III FT to Cat I FT with initiation of all interventions -Continue other mgmt as ordered  Charliee Krenz LYNN,CNM, MSN 09/22/2013, 2:48 AM

## 2013-09-22 NOTE — Anesthesia Postprocedure Evaluation (Signed)
  Anesthesia Post-op Note  Patient: Miranda KraftShelby Luna  Procedure(s) Performed: * No procedures listed *  Patient Location: PACU and Mother/Baby  Anesthesia Type:Epidural  Level of Consciousness: awake  Airway and Oxygen Therapy: Patient Spontanous Breathing  Post-op Pain: mild  Post-op Assessment: Patient's Cardiovascular Status Stable and Respiratory Function Stable  Post-op Vital Signs: stable  Last Vitals:  Filed Vitals:   09/22/13 1240  BP: 151/84  Pulse: 102  Temp: 37.1 C  Resp: 20    Complications: No apparent anesthesia complications

## 2013-09-22 NOTE — Anesthesia Preprocedure Evaluation (Signed)
Anesthesia Evaluation  Patient identified by MRN, date of birth, ID band Patient awake    Reviewed: Allergy & Precautions, H&P , NPO status , Patient's Chart, lab work & pertinent test results  History of Anesthesia Complications Negative for: history of anesthetic complications  Airway Mallampati: III TM Distance: >3 FB Neck ROM: Full    Dental   Pulmonary          Cardiovascular hypertension, Rhythm:Regular     Neuro/Psych  Headaches, negative psych ROS   GI/Hepatic negative GI ROS, Neg liver ROS,   Endo/Other  Morbid obesity  Renal/GU negative Renal ROS     Musculoskeletal   Abdominal   Peds  Hematology  (+) anemia ,   Anesthesia Other Findings   Reproductive/Obstetrics (+) Pregnancy                           Anesthesia Physical Anesthesia Plan  ASA: II  Anesthesia Plan: Epidural   Post-op Pain Management:    Induction:   Airway Management Planned:   Additional Equipment:   Intra-op Plan:   Post-operative Plan:   Informed Consent: I have reviewed the patients History and Physical, chart, labs and discussed the procedure including the risks, benefits and alternatives for the proposed anesthesia with the patient or authorized representative who has indicated his/her understanding and acceptance.   Dental advisory given  Plan Discussed with: Anesthesiologist  Anesthesia Plan Comments:         Anesthesia Quick Evaluation

## 2013-09-22 NOTE — Progress Notes (Signed)
Miranda KraftShelby Saephan   Subjective: Post Partum Day 0 Vaginal delivery, 1 degree laceration Patient up ad lib, denies syncope or dizziness. Reports consuming regular diet without issues and denies N/V No issues with urination and reports bleeding is appropriate  Feeding:  Attempted breast and didn't like th way it felt so she will feed with formula Contraceptive plan:   OCP  Objective: Temp:  [98.2 F (36.8 C)-98.8 F (37.1 C)] 98.8 F (37.1 C) (08/06 0300) Pulse Rate:  [74-108] 101 (08/06 0631) Resp:  [18-20] 18 (08/06 0631) BP: (139-179)/(80-119) 155/105 mmHg (08/06 0631) SpO2:  [98 %-100 %] 98 % (08/06 0112)  Physical Exam:  General: alert and cooperative Ext: WNL, slight edema. No evidence of DVT seen on physical exam. Breast: Soft Lungs: CTAB Heart RRR without murmur  Abdomen:  Soft, fundus firm, lochia scant, + bowel sounds, non distended, non tender Lochia: appropriate Uterine Fundus: firm Laceration: healing well    Recent Labs  09/20/13 2220 09/21/13 2344  HGB 11.2* 11.5*  HCT 34.8* 35.2*    Assessment S/P Vaginal Delivery-Day 0 Stable  Normal Involution Bottlefeeding Circumcision: n/a  Plan: Continue current care Dr. Sallye OberKulwa  updated on patient status  Contraception OCP    Maydell Knoebel, CNM, MSN 09/22/2013, 8:58 AM

## 2013-09-22 NOTE — Progress Notes (Addendum)
Nurse called to report BPs 168/16, 169/101  Give 10mg  Labetalol IV Increase PO Labetalol to 400mg  bid Consulted with Dr Su Hiltoberts   FHT: Category 1  CTX: 2-6, moderate

## 2013-09-22 NOTE — Anesthesia Procedure Notes (Signed)
Epidural Patient location during procedure: OB Start time: 09/22/2013 12:16 AM End time: 09/22/2013 12:27 AM  Staffing Anesthesiologist: Medea Deines, CHRIS Performed by: anesthesiologist   Preanesthetic Checklist Completed: patient identified, surgical consent, pre-op evaluation, timeout performed, IV checked, risks and benefits discussed and monitors and equipment checked  Epidural Patient position: sitting Prep: site prepped and draped and DuraPrep Patient monitoring: heart rate, cardiac monitor, continuous pulse ox and blood pressure Approach: midline Location: L3-L4 Injection technique: LOR saline  Needle:  Needle type: Tuohy  Needle gauge: 17 G Needle length: 9 cm Needle insertion depth: 8 cm Catheter type: closed end flexible Catheter size: 19 Gauge Catheter at skin depth: 13 cm Test dose: Other  Assessment Events: blood not aspirated, injection not painful, no injection resistance, negative IV test and no paresthesia  Additional Notes H+P and labs checked, risks and benefits discussed with the patient, consent obtained, procedure tolerated well and without complications.  Reason for block:procedure for pain

## 2013-09-22 NOTE — Progress Notes (Addendum)
Addendum  Cervix unchanged Decrease IVF to 75cc/hr Dr Su Hiltoberts consulted

## 2013-09-22 NOTE — Consult Note (Signed)
Neonatology Note:  Attendance at Delivery:  I was asked by Jessica Emily, CNM for Dr. Dillard to attend this NSVD at term. The mother is a G2P0A1 A pos, GBS neg with thick meconium-stained fluid, gestational HTN, late PNC (at 36 weeks, no glucose tolerance testing done), obesity,and NRFHR (minimal variability, late FHR decelerations). ROM 5 hours prior to delivery, fluid with dark green meconium s/p amnioinfusion. Mother was on Labetalol and on Valtrex suppression due to a history of HSV, without recent lesions. At delivery, infant had good muscle tone but delayed cry; bulb suctioned by RN as cord was being clamped and cut, prior to first breath. We did bulb suctioning again as she was placed on the warmer and just as she began to cry, getting only scant, dark green fluid. She pinked up quickly and breathed well, with good tone and HR, but did not cry vigorously, so we gave some stimulation. We did DeLee suctioning once and got 5 ml of dark green, semi-particulate material out. Ap 8/9. Lungs still had some fine rales to ausc in DR after 5 minutes, but the baby was not distressed and was pink, so we did not intervene further. I spoke with her parents, then transferred her to CN status and to care of Pediatrician.  Gina Costilla C. Racer Quam, MD  

## 2013-09-22 NOTE — Plan of Care (Signed)
Problem: Phase I Progression Outcomes Goal: VS, stable, temp < 100.4 degrees F Outcome: Progressing B/P's high on Labetalol

## 2013-09-22 NOTE — Progress Notes (Signed)
Delivery call made

## 2013-09-23 LAB — CBC
HCT: 30.7 % — ABNORMAL LOW (ref 36.0–46.0)
Hemoglobin: 9.5 g/dL — ABNORMAL LOW (ref 12.0–15.0)
MCH: 27.1 pg (ref 26.0–34.0)
MCHC: 30.9 g/dL (ref 30.0–36.0)
MCV: 87.7 fL (ref 78.0–100.0)
PLATELETS: 171 10*3/uL (ref 150–400)
RBC: 3.5 MIL/uL — ABNORMAL LOW (ref 3.87–5.11)
RDW: 16.7 % — AB (ref 11.5–15.5)
WBC: 11.4 10*3/uL — AB (ref 4.0–10.5)

## 2013-09-23 MED ORDER — LABETALOL HCL 200 MG PO TABS
600.0000 mg | ORAL_TABLET | Freq: Four times a day (QID) | ORAL | Status: DC
  Administered 2013-09-23 – 2013-09-24 (×5): 600 mg via ORAL
  Filled 2013-09-23 (×5): qty 3

## 2013-09-23 NOTE — Progress Notes (Addendum)
Subjective: Postpartum Day 1: Vaginal delivery, 1st degree vaginal/periurethral lacerations Patient up ad lib, reports no syncope or dizziness.  Denies HA, visual sx, or epigastric pain.  "Having no pain", but taking Motrin anyway. Feeding:  Bottle Contraceptive plan:  OrthoTricyclen Lo  Objective: Vital signs in last 24 hours: Temp:  [97.6 F (36.4 C)-98.7 F (37.1 C)] 98.4 F (36.9 C) (08/07 0555) Pulse Rate:  [73-102] 87 (08/07 0555) Resp:  [16-20] 18 (08/07 0555) BP: (139-158)/(84-98) 158/98 mmHg (08/07 0555)  Filed Vitals:   09/22/13 1240 09/22/13 2001 09/23/13 0357 09/23/13 0555  BP: 151/84 158/97 139/92 158/98  Pulse: 102 78 73 87  Temp: 98.7 F (37.1 C) 98.2 F (36.8 C) 97.6 F (36.4 C) 98.4 F (36.9 C)  TempSrc: Oral Oral Oral Oral  Resp: 20 16 18 18   Height:      Weight:      SpO2:      Orthostatics stable.   Physical Exam:  General: alert Lochia: appropriate Uterine Fundus: firm Perineum: healing well DVT Evaluation: No evidence of DVT seen on physical exam. Negative Homan's sign. Calf/Ankle edema is present.    Recent Labs  09/21/13 2344 09/23/13 0545  HGB 11.5* 9.5*  HCT 35.2* 30.7*    Assessment/Plan: Status post vaginal delivery day 1+ edema in LE. Chronic vs gestational hypertension--late to care at 37 weeks. BPs still elevated--on Labetalol 400 mg po QID Consulted with Dr. Ivar Drapeillard--will increase Labetalol to 600 mg po QID.  Next dose due at 10am. Mild anemia--hemodynamic stability, on Fe Anticipate d/c tomorrow  Nyra CapesLATHAM, VICKICNM 09/23/2013, 9:21 AM

## 2013-09-23 NOTE — Plan of Care (Signed)
Problem: Discharge Progression Outcomes Goal: MMR given as ordered Outcome: Not Met (add Reason) Needs- rubella non-immune

## 2013-09-24 MED ORDER — LABETALOL HCL 300 MG PO TABS: 600.0000 mg | ORAL_TABLET | Freq: Four times a day (QID) | ORAL | Status: DC

## 2013-09-24 MED ORDER — NORGESTIM-ETH ESTRAD TRIPHASIC 0.18/0.215/0.25 MG-25 MCG PO TABS: 1.0000 | ORAL_TABLET | Freq: Every day | ORAL | Status: DC

## 2013-09-24 MED ORDER — OXYCODONE-ACETAMINOPHEN 5-325 MG PO TABS: 1.0000 | ORAL_TABLET | ORAL | Status: DC | PRN

## 2013-09-24 MED ORDER — IBUPROFEN 600 MG PO TABS: 600.0000 mg | ORAL_TABLET | Freq: Four times a day (QID) | ORAL | Status: DC | PRN

## 2013-09-24 NOTE — Discharge Summary (Signed)
Vaginal Delivery Discharge Summary  Miranda Luna  DOB:    11/25/1980 MRN:    [id number] CSN:    [id number]  Date of admission:                 09/20/13  Date of discharge:                   09/24/13  Procedures this admission:   SVB, repair of 1st degree perineal and periurethral  Date of Delivery: 09/22/13  Newborn Data:  Live born female  Birth Weight: 7 lb 7.9 oz (3400 g) APGAR: 8, 9  Home with mother. Name: Miranda Luna   History of Present Illness:  [name] is a 33 y.o. female, G2P1011, who presents at 1466w2d weeks gestation. [facility]. She was admitted induction of labor due to gestational hypertension. Her pregnancy has been complicated by:  Patient Active Problem List   Diagnosis Date Noted  . Vaginal delivery 09/24/2013  . Hx of migraines 09/21/2013  . Late prenatal care in third trimester--36 weeks 09/16/2013  . Gestational hypertension--on Labetalol 09/16/2013  . 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 Severe obesity (BMI >= 40) 09/16/2013     Hospital Course:  Admitted 09/20/13 for induction. Negative GBS. Progressed with cytotech initially, then pitocin. Utilized epidural for pain management.  PIH labs and PCR done on admission were WNL, but patient did require several doses of IV Labetalol and Hydralazine during her labor.  Her po Labetalol dose was increased during labor from 400 mg po BID to 400 mg QID.  Patient had decels during her labor, but progressed rapidly through transition.  Delivery was performed by Gerrit HeckJessica Emly, CNM, without complication. Patient and baby tolerated the procedure without difficulty, with 1st degree vaginal and periurethral lacerations noted. Infant status was stable and remained in room with mother.  Mother and infant then had an uncomplicated postpartum course, with bottle feeding going well. Mom's physical exam  was WNL, and she was discharged home in stable condition. She was placed on Labetalol 600 mg po QID.  Contraception plan was OrthoTricyclen Lo.  She received adequate benefit from po pain medications, with Ibuprophen and Percocet Rx'd.  [facility] nurse to see patient during this upcoming week for BP check.   Feeding:  bottle  Contraception:  oral contraceptives (estrogen/progesterone)  Discharge hemoglobin:  Hemoglobin  Date Value Ref Range Status  09/23/2013 9.5* 12.0 - 15.0 g/dL Final     DELTA CHECK NOTED     REPEATED TO VERIFY     HCT  Date Value Ref Range Status  09/23/2013 30.7* 36.0 - 46.0 % Final    Discharge Physical Exam:   General: alert Lochia: appropriate Uterine Fundus: firm Incision: healing well DVT Evaluation: No evidence of DVT seen on physical exam. Negative Homan's sign.  Intrapartum Procedures: spontaneous vaginal delivery Postpartum Procedures: none Complications-Operative and Postpartum: 1st  degree perineal laceration and periurethral  Discharge Diagnoses: Term Pregnancy-delivered and gestational hypertension, late to care  Discharge Information:  Activity:           pelvic rest Diet:                routine Medications: Ibuprofen and Labetalol Condition:      stable Instructions:     Discharge to: home  Follow-up Information   Follow up with [facility] In 6 weeks. (Call for  any questions or concerns.)    Specialty:  Obstetrics and Gynecology   Contact information:   [address] [place] [address] [phone]      Follow up with [facility] In 6 weeks. (Call for any questions or concerns.  [facility] nurse to come see you this week to check your BP.)    Specialty:  Obstetrics and Gynecology   Contact information:   [address] [place] [address] [phone]       Nigel Bridgeman CNM 09/24/2013 10:55 AM

## 2013-09-24 NOTE — Discharge Instructions (Signed)
Postpartum Care After Vaginal Delivery °After you deliver your newborn (postpartum period), the usual stay in the hospital is 24-72 hours. If there were problems with your labor or delivery, or if you have other medical problems, you might be in the hospital longer.  °While you are in the hospital, you will receive help and instructions on how to care for yourself and your newborn during the postpartum period.  °While you are in the hospital: °· Be sure to tell your nurses if you have pain or discomfort, as well as where you feel the pain and what makes the pain worse. °· If you had an incision made near your vagina (episiotomy) or if you had some tearing during delivery, the nurses may put ice packs on your episiotomy or tear. The ice packs may help to reduce the pain and swelling. °· If you are breastfeeding, you may feel uncomfortable contractions of your uterus for a couple of weeks. This is normal. The contractions help your uterus get back to normal size. °· It is normal to have some bleeding after delivery. °¨ For the first 1-3 days after delivery, the flow is red and the amount may be similar to a period. °¨ It is common for the flow to start and stop. °¨ In the first few days, you may pass some small clots. Let your nurses know if you begin to pass large clots or your flow increases. °¨ Do not  flush blood clots down the toilet before having the nurse look at them. °¨ During the next 3-10 days after delivery, your flow should become more watery and pink or brown-tinged in color. °¨ Ten to fourteen days after delivery, your flow should be a small amount of yellowish-white discharge. °¨ The amount of your flow will decrease over the first few weeks after delivery. Your flow may stop in 6-8 weeks. Most women have had their flow stop by 12 weeks after delivery. °· You should change your sanitary pads frequently. °· Wash your hands thoroughly with soap and water for at least 20 seconds after changing pads, using  the toilet, or before holding or feeding your newborn. °· You should feel like you need to empty your bladder within the first 6-8 hours after delivery. °· In case you become weak, lightheaded, or faint, call your nurse before you get out of bed for the first time and before you take a shower for the first time. °· Within the first few days after delivery, your breasts may begin to feel tender and full. This is called engorgement. Breast tenderness usually goes away within 48-72 hours after engorgement occurs. You may also notice milk leaking from your breasts. If you are not breastfeeding, do not stimulate your breasts. Breast stimulation can make your breasts produce more milk. °· Spending as much time as possible with your newborn is very important. During this time, you and your newborn can feel close and get to know each other. Having your newborn stay in your room (rooming in) will help to strengthen the bond with your newborn.  It will give you time to get to know your newborn and become comfortable caring for your newborn. °· Your hormones change after delivery. Sometimes the hormone changes can temporarily cause you to feel sad or tearful. These feelings should not last more than a few days. If these feelings last longer than that, you should talk to your caregiver. °· If desired, talk to your caregiver about methods of family planning or contraception. °·   Talk to your caregiver about immunizations. Your caregiver may want you to have the following immunizations before leaving the hospital: °¨ Tetanus, diphtheria, and pertussis (Tdap) or tetanus and diphtheria (Td) immunization. It is very important that you and your family (including grandparents) or others caring for your newborn are up-to-date with the Tdap or Td immunizations. The Tdap or Td immunization can help protect your newborn from getting ill. °¨ Rubella immunization. °¨ Varicella (chickenpox) immunization. °¨ Influenza immunization. You should  receive this annual immunization if you did not receive the immunization during your pregnancy. °Document Released: 12/01/2006 Document Revised: 10/29/2011 Document Reviewed: 10/01/2011 °ExitCare® Patient Information ©2015 [employer]. This information is not intended to replace advice given to you by your health care provider. Make sure you discuss any questions you have with your health care provider. ° °Hypertension During Pregnancy °Hypertension, or high blood pressure, is when there is extra pressure inside your blood vessels that carry blood from the heart to the rest of your body (arteries). It can happen at any time in life, including pregnancy. Hypertension during pregnancy can cause problems for you and your baby. Your baby might not weigh as much as he or she should at birth or might be born early (premature). Very bad cases of hypertension during pregnancy can be life-threatening.  °Different types of hypertension can occur during pregnancy. These include: °· Chronic hypertension. This happens when a woman has hypertension before pregnancy and it continues during pregnancy. °· Gestational hypertension. This is when hypertension develops during pregnancy. °· Preeclampsia or toxemia of pregnancy. This is a very serious type of hypertension that develops only during pregnancy. It affects the whole body and can be very dangerous for both mother and baby.   °Gestational hypertension and preeclampsia usually go away after your baby is born. Your blood pressure will likely stabilize within 6 weeks. Women who have hypertension during pregnancy have a greater chance of developing hypertension later in life or with future pregnancies. °RISK FACTORS °There are certain factors that make it more likely for you to develop hypertension during pregnancy. These include: °· Having hypertension before pregnancy. °· Having hypertension during a previous pregnancy. °· Being overweight. °· Being older than 40  years. °· Being pregnant with more than one baby. °· Having diabetes or kidney problems. °SIGNS AND SYMPTOMS °Chronic and gestational hypertension rarely cause symptoms. Preeclampsia has symptoms, which may include: °· Increased protein in your urine. Your health care provider will check for this at every prenatal visit. °· Swelling of your hands and face. °· Rapid weight gain. °· Headaches. °· Visual changes. °· Being bothered by light. °· Abdominal pain, especially in the upper right area. °· Chest pain. °· Shortness of breath. °· Increased reflexes. °· Seizures. These occur with a more severe form of preeclampsia, called eclampsia. °DIAGNOSIS  °You may be diagnosed with hypertension during a regular prenatal exam. At each prenatal visit, you may have: °· Your blood pressure checked. °· A urine test to check for protein in your urine. °The type of hypertension you are diagnosed with depends on when you developed it. It also depends on your specific blood pressure reading. °· Developing hypertension before 20 weeks of pregnancy is consistent with chronic hypertension. °· Developing hypertension after 20 weeks of pregnancy is consistent with gestational hypertension. °· Hypertension with increased urinary protein is diagnosed as preeclampsia. °· Blood pressure measurements that stay above 160 systolic or 110 diastolic are a sign of severe preeclampsia. °TREATMENT °Treatment for hypertension during pregnancy   varies. Treatment depends on the type of hypertension and how serious it is. °· If you take medicine for chronic hypertension, you may need to switch medicines. °¨ Medicines called ACE inhibitors should not be taken during pregnancy. °¨ Low-dose aspirin may be suggested for women who have risk factors for preeclampsia. °· If you have gestational hypertension, you may need to take a blood pressure medicine that is safe during pregnancy. Your health care provider will recommend the correct medicine. °· If you have  severe preeclampsia, you may need to be in the hospital. Health care providers will watch you and your baby very closely. You also may need to take medicine called magnesium sulfate to prevent seizures and lower blood pressure. °· Sometimes, an early delivery is needed. This may be the case if the condition worsens. It would be done to protect you and your baby. The only cure for preeclampsia is delivery. °· Your health care provider may recommend that you take one low-dose aspirin (81 mg) each day to help prevent high blood pressure during your pregnancy if you are at risk for preeclampsia. You may be at risk for preeclampsia if: °¨ You had preeclampsia or eclampsia during a previous pregnancy. °¨ Your baby did not grow as expected during a previous pregnancy. °¨ You experienced preterm birth with a previous pregnancy. °¨ You experienced a separation of the placenta from the uterus (placental abruption) during a previous pregnancy. °¨ You experienced the loss of your baby during a previous pregnancy. °¨ You are pregnant with more than one baby. °¨ You have other medical conditions, such as diabetes or an autoimmune disease. °HOME CARE INSTRUCTIONS °· Schedule and keep all of your regular prenatal care appointments. This is important. °· Take medicines only as directed by your health care provider. Tell your health care provider about all medicines you take. °· Eat as little salt as possible. °· Get regular exercise. °· Do not drink alcohol. °· Do not use tobacco products. °· Do not drink products with caffeine. °· Lie on your left side when resting. °SEEK IMMEDIATE MEDICAL CARE IF: °· You have severe abdominal pain. °· You have sudden swelling in your hands, ankles, or face. °· You gain 4 pounds (1.8 kg) or more in 1 week. °· You vomit repeatedly. °· You have vaginal bleeding. °· You have a headache. °· You have blurred or double vision. °· You have muscle twitching or spasms. °· You have shortness of  breath. °· You have blue fingernails or lips. °· You have blood in your urine. °MAKE SURE YOU: °· Understand these instructions. °· Will watch your condition. °· Will get help right away if you are not doing well or get worse. °Document Released: 10/22/2010 Document Revised: 06/20/2013 Document Reviewed: 09/02/2012 °ExitCare® Patient Information ©2015 [employer]. This information is not intended to replace advice given to you by your health care provider. Make sure you discuss any questions you have with your health care provider. ° ° °

## 2013-12-01 ENCOUNTER — Ambulatory Visit: Payer: BC Managed Care – PPO | Admitting: Family

## 2013-12-19 ENCOUNTER — Encounter (HOSPITAL_COMMUNITY): Payer: Self-pay

## 2017-05-18 ENCOUNTER — Other Ambulatory Visit: Payer: Self-pay

## 2017-05-18 ENCOUNTER — Encounter: Payer: Self-pay | Admitting: Emergency Medicine

## 2017-05-18 ENCOUNTER — Emergency Department
Admission: EM | Admit: 2017-05-18 | Discharge: 2017-05-18 | Disposition: A | Discharge status: Home / Self Care | Payer: Managed Care, Other (non HMO) | Attending: Emergency Medicine | Admitting: Emergency Medicine

## 2017-05-18 ENCOUNTER — Emergency Department: Payer: Managed Care, Other (non HMO)

## 2017-05-18 DIAGNOSIS — N200 Calculus of kidney: Secondary | ICD-10-CM | POA: Diagnosis not present

## 2017-05-18 DIAGNOSIS — R1011 Right upper quadrant pain: Secondary | ICD-10-CM | POA: Diagnosis present

## 2017-05-18 LAB — BASIC METABOLIC PANEL
Anion gap: 10 (ref 5–15)
BUN: 11 mg/dL (ref 6–20)
CALCIUM: 9 mg/dL (ref 8.9–10.3)
CO2: 27 mmol/L (ref 22–32)
CREATININE: 0.98 mg/dL (ref 0.44–1.00)
Chloride: 101 mmol/L (ref 101–111)
GFR calc non Af Amer: >60 mL/min (ref >60)
Glucose, Bld: 223 mg/dL — ABNORMAL HIGH (ref 65–99)
Potassium: 3.2 mmol/L — ABNORMAL LOW (ref 3.5–5.1)
SODIUM: 138 mmol/L (ref 135–145)

## 2017-05-18 LAB — HCG, QUANTITATIVE, PREGNANCY: hCG, Beta Chain, Quant, S: <1 m[IU]/mL (ref <5)

## 2017-05-18 MED ORDER — ONDANSETRON 4 MG PO TBDP: 4.0000 mg | ORAL_TABLET | Freq: Three times a day (TID) | ORAL | 0 refills | Status: DC | PRN

## 2017-05-18 MED ORDER — IBUPROFEN 600 MG PO TABS: 600.0000 mg | ORAL_TABLET | Freq: Three times a day (TID) | ORAL | 0 refills | Status: DC | PRN

## 2017-05-18 MED ORDER — HYDROCODONE-ACETAMINOPHEN 5-325 MG PO TABS
1.0000 | ORAL_TABLET | Freq: Once | ORAL | Status: AC
  Administered 2017-05-18: 1 via ORAL
  Filled 2017-05-18: qty 1

## 2017-05-18 MED ORDER — HYDROCODONE-ACETAMINOPHEN 5-325 MG PO TABS: 1.0000 | ORAL_TABLET | Freq: Four times a day (QID) | ORAL | 0 refills | Status: DC | PRN

## 2017-05-18 MED ORDER — MORPHINE SULFATE (PF) 4 MG/ML IV SOLN
8.0000 mg | Freq: Once | INTRAVENOUS | Status: AC
Start: 2017-05-18 — End: 2017-05-18
  Administered 2017-05-18: 8 mg via INTRAVENOUS
  Filled 2017-05-18: qty 2

## 2017-05-18 MED ORDER — ONDANSETRON HCL 4 MG/2ML IJ SOLN
4.0000 mg | Freq: Once | INTRAMUSCULAR | Status: AC
  Administered 2017-05-18: 4 mg via INTRAVENOUS
  Filled 2017-05-18: qty 2

## 2017-05-18 MED ORDER — TAMSULOSIN HCL 0.4 MG PO CAPS: 0.4000 mg | ORAL_CAPSULE | Freq: Every day | ORAL | 0 refills | Status: DC

## 2017-05-18 MED ORDER — KETOROLAC TROMETHAMINE 30 MG/ML IJ SOLN
15.0000 mg | Freq: Once | INTRAMUSCULAR | Status: AC
  Administered 2017-05-18: 15 mg via INTRAVENOUS
  Filled 2017-05-18: qty 1

## 2017-05-18 NOTE — Discharge Instructions (Signed)
Please take your Flomax every single day and take your pain and nausea medication as needed for severe symptoms.  Make an appointment to follow-up with the urologist for reevaluation and return to the emergency department for any concerns particularly if you develop a fever or chills.  It was a pleasure to take care of you today, and thank you for coming to our emergency department.  If you have any questions or concerns before leaving please ask the nurse to grab me and I'm more than happy to go through your aftercare instructions again.  If you were prescribed any opioid pain medication today such as Norco, Vicodin, Percocet, morphine, hydrocodone, or oxycodone please make sure you do not drive when you are taking this medication as it can alter your ability to drive safely.  If you have any concerns once you are home that you are not improving or are in fact getting worse before you can make it to your follow-up appointment, please do not hesitate to call 911 and come back for further evaluation.  Merrily Brittle, MD  Results for orders placed or performed during the hospital encounter of 05/18/17  hCG, quantitative, pregnancy  Result Value Ref Range   hCG, Beta Chain, Quant, S <1 <5 mIU/mL  Basic metabolic panel  Result Value Ref Range   Sodium 138 135 - 145 mmol/L   Potassium 3.2 (L) 3.5 - 5.1 mmol/L   Chloride 101 101 - 111 mmol/L   CO2 27 22 - 32 mmol/L   Glucose, Bld 223 (H) 65 - 99 mg/dL   BUN 11 6 - 20 mg/dL   Creatinine, Ser 1.61 0.44 - 1.00 mg/dL   Calcium 9.0 8.9 - 09.6 mg/dL   GFR calc non Af Amer >60 >60 mL/min   GFR calc Af Amer >60 >60 mL/min   Anion gap 10 5 - 15   Ct Renal Stone Study  Result Date: 05/18/2017 CLINICAL DATA:  RIGHT flank pain beginning at 1 a.m. tonight. Hematuria for a few days. Vomiting. EXAM: CT ABDOMEN AND PELVIS WITHOUT CONTRAST TECHNIQUE: Multidetector CT imaging of the abdomen and pelvis was performed following the standard protocol without IV  contrast. COMPARISON:  None. FINDINGS: LOWER CHEST: 3 mm subsolid RIGHT lower lobe pulmonary nodule, no routine indicated follow-up by consensus. Heart size is upper limits of normal in size. No pericardial effusion. HEPATOBILIARY: Diffusely hypodense liver compatible with steatosis, mild focal fatty sparing about the gallbladder fossa. Normal gallbladder. PANCREAS: Normal. SPLEEN: Normal. ADRENALS/URINARY TRACT: Kidneys are orthotopic, the RIGHT is mildly larger, likely edematous. Mild RIGHT hydronephrosis. 5 mm RIGHT ureteral pelvic junction calculus. 2 mm RIGHT lower pole nephrolithiasis. No LEFT hydronephrosis or nephrolithiasis. RIGHT perinephric fat stranding. Limited assessment for renal masses on this nonenhanced examination. The unopacified ureters are normal in course and caliber. Urinary bladder is partially distended and unremarkable. Normal adrenal glands. STOMACH/BOWEL: The stomach, small and large bowel are normal in course and caliber without inflammatory changes, sensitivity decreased by lack of enteric contrast. Mild colonic diverticulosis. Normal appendix. VASCULAR/LYMPHATIC: Aortoiliac vessels are normal in course and caliber. No lymphadenopathy by CT size criteria. REPRODUCTIVE: Lobulated uterine contour most consistent with leiomyomas including 3.9 x 4.2 cm LEFT probable subserosal leiomyoma. OTHER: No intraperitoneal free fluid or free air. Phleboliths in the pelvis. MUSCULOSKELETAL: Non-acute. Small fat containing umbilical hernia. Moderate RIGHT sacroiliac osteoarthrosis. Mild L5-S1 degenerative disc with endplate spurring resulting in moderate to severe LEFT L5-S1 neural foraminal narrowing. IMPRESSION: 1. 5 mm RIGHT ureteropelvic junction wall calculus  resulting in mild hydronephrosis. 2. Residual RIGHT nephrolithiasis measuring to 2 mm. 3. Hepatic steatosis. Electronically Signed   By: Awilda Metroourtnay  Bloomer M.D.   On: 05/18/2017 06:09

## 2017-05-18 NOTE — ED Notes (Signed)
Per CT, they are waiting on neg preg test for study. This RN informed CT that we are doing a blood test and that they will be called when results are available.

## 2017-05-18 NOTE — ED Triage Notes (Signed)
Pt reports waking around 1am with pain to right flank only; says a few days ago she noticed blood in her urine but didn't pay much attention to that; nausea, vomited x 1 before coming to ED;

## 2017-05-18 NOTE — ED Provider Notes (Signed)
[facility] Emergency Department Provider Note  ____________________________________________   First MD Initiated Contact with Patient 05/18/17 [phone]     (approximate)  I have reviewed the triage vital signs and the nursing notes.   HISTORY  Chief Complaint Flank Pain   HPI Miranda Luna is a 37 y.o. female who self presents to the emergency department with sudden onset severe right flank pain that woke her from sleep at 1 AM several hours prior to arrival.  The pain is sharp throbbing aching severe right flank radiating towards her right groin and associated with nausea.  The pain is currently constant.  Nothing seems to make it better or worse.  She has no history of abdominal surgeries.  No diarrhea.  No history of renal colic.  She denies fevers or chills.  She denies dysuria frequency or hesitancy.  She denies hematuria.  Past Medical History:  Diagnosis Date  . Genital HSV   . Migraines     Patient Active Problem List   Diagnosis Date Noted  . Vaginal delivery 09/24/2013  . Hx of migraines 09/21/2013  . Late prenatal care in third trimester--36 weeks 09/16/2013  . Gestational hypertension--on Labetalol 09/16/2013  . 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 Severe obesity (BMI >= 40) (HCC) 09/16/2013    Past Surgical History:  Procedure Laterality Date  . ADENOIDECTOMY    . TONSILLECTOMY      Prior to Admission medications   Medication Sig Start Date End Date Taking? Authorizing Provider  Norgestimate-Ethinyl Estradiol Triphasic (ORTHO TRI-CYCLEN LO) 0.18/0.215/0.25 MG-25 MCG tab Take 1 tablet by mouth daily. 09/24/13  Yes Nigel Bridgeman, CNM  HYDROcodone-acetaminophen (NORCO) 5-325 MG tablet Take 1 tablet by mouth every 6 (six) hours as needed for up to 15 doses for severe pain. 05/18/17   Merrily Brittle, MD  ibuprofen (ADVIL,MOTRIN) 600 MG tablet Take 1 tablet (600 mg total) by mouth every 8 (eight) hours as needed. 05/18/17    Merrily Brittle, MD  ondansetron (ZOFRAN ODT) 4 MG disintegrating tablet Take 1 tablet (4 mg total) by mouth every 8 (eight) hours as needed for nausea or vomiting. 05/18/17   Merrily Brittle, MD  tamsulosin (FLOMAX) 0.4 MG CAPS capsule Take 1 capsule (0.4 mg total) by mouth daily. 05/18/17   Merrily Brittle, MD    Allergies Patient has no known allergies.  Family History  Problem Relation Age of Onset  . Hypertension Mother   . Heart disease Mother   . Diabetes Maternal Grandmother   . Hypertension Maternal Grandmother     Social History Social History   Tobacco Use  . Smoking status: Never Smoker  . Smokeless tobacco: Never Used  Substance Use Topics  . Alcohol use: Yes    Comment: ocassional  . Drug use: No    Review of Systems Constitutional: No fever/chills Eyes: No visual changes. ENT: No sore throat. Cardiovascular: Denies chest pain. Respiratory: Denies shortness of breath. Gastrointestinal: Positive for abdominal pain.  Positive for nausea, no vomiting.  No diarrhea.  No constipation. Genitourinary: Negative for dysuria. Musculoskeletal: Positive for back pain. Skin: Negative for rash. Neurological: Negative for headaches, focal weakness or numbness.   ____________________________________________   PHYSICAL EXAM:  VITAL SIGNS: ED Triage Vitals  Enc Vitals Group     BP 05/18/17 0407 (!) 160/99     Pulse Rate 05/18/17 0407 76     Resp 05/18/17 0407 18     Temp 05/18/17 0407 98.3 F (36.8 C)  Temp Source 05/18/17 0407 Oral     SpO2 05/18/17 0407 99 %     Weight 05/18/17 0408 274 lb (124.3 kg)     Height 05/18/17 0408 5' 7.5" (1.715 m)     Head Circumference --      Peak Flow --      Pain Score 05/18/17 0408 10     Pain Loc --      Pain Edu? --      Excl. in GC? --     Constitutional: Alert and oriented x4 tearful and uncomfortable appearing nontoxic no diaphoresis speaks in full clear sentences Eyes: PERRL EOMI. Head: Atraumatic. Nose: No  congestion/rhinnorhea. Mouth/Throat: No trismus Neck: No stridor.   Cardiovascular: Normal rate, regular rhythm. Grossly normal heart sounds.  Good peripheral circulation. Respiratory: Normal respiratory effort.  No retractions. Lungs CTAB and moving good air Gastrointestinal: Morbidly obese abdomen soft nontender no rebound or guarding no peritonitis no costovertebral tenderness Musculoskeletal: No lower extremity edema   Neurologic:  Normal speech and language. No gross focal neurologic deficits are appreciated. Skin:  Skin is warm, dry and intact. No rash noted. Psychiatric: Mood and affect are normal. Speech and behavior are normal.    ____________________________________________   DIFFERENTIAL includes but not limited to  Appendicitis, diverticulitis, pyelonephritis, nephrolithiasis ____________________________________________   LABS (all labs ordered are listed, but only abnormal results are displayed)  Labs Reviewed  BASIC METABOLIC PANEL - Abnormal; Notable for the following components:      Result Value   Potassium 3.2 (*)    Glucose, Bld 223 (*)    All other components within normal limits  HCG, QUANTITATIVE, PREGNANCY  URINALYSIS, COMPLETE (UACMP) WITH MICROSCOPIC  POC URINE PREG, ED    Lab work reviewed by me shows normal renal function __________________________________________  EKG   ____________________________________________  RADIOLOGY  CT stone reviewed by me shows 5 mm right sided proximal kidney stone ____________________________________________   PROCEDURES  Procedure(s) performed: no  Procedures  Critical Care performed: no  Observation: no ____________________________________________   INITIAL IMPRESSION / ASSESSMENT AND PLAN / ED COURSE  Pertinent labs & imaging results that were available during my care of the patient were reviewed by me and considered in my medical decision making (see chart for details).  The patient arrives  extremely uncomfortable appearing with sudden onset severe right flank pain which is consistent with renal colic.  Given 8 mg of morphine IV along with Toradol and Zofran which improved her symptoms.  Fortunately her renal function is normal.  CT scan does confirm a 5 mm stone on the right.  She has no clinical signs to suggest pyelonephritis or infection.  Her pain is now adequately controlled.  Strict return precautions have been given and I will refer her to urology as an outpatient.  She verbalizes understanding and agreement with the plan.      ____________________________________________   FINAL CLINICAL IMPRESSION(S) / ED DIAGNOSES  Final diagnoses:  Kidney stone      NEW MEDICATIONS STARTED DURING THIS VISIT:  New Prescriptions   HYDROCODONE-ACETAMINOPHEN (NORCO) 5-325 MG TABLET    Take 1 tablet by mouth every 6 (six) hours as needed for up to 15 doses for severe pain.   IBUPROFEN (ADVIL,MOTRIN) 600 MG TABLET    Take 1 tablet (600 mg total) by mouth every 8 (eight) hours as needed.   ONDANSETRON (ZOFRAN ODT) 4 MG DISINTEGRATING TABLET    Take 1 tablet (4 mg total) by mouth every 8 (eight) hours  as needed for nausea or vomiting.   TAMSULOSIN (FLOMAX) 0.4 MG CAPS CAPSULE    Take 1 capsule (0.4 mg total) by mouth daily.     Note:  This document was prepared using Dragon voice recognition software and may include unintentional dictation errors.     Merrily Brittleifenbark, Yoel Kaufhold, MD 05/18/17 [phone]

## 2017-05-24 NOTE — Progress Notes (Signed)
05/25/2017 4:16 PM   Miranda Luna 1980/08/29 [id number]  Referring provider: Damaris Hippo, [address] [place], [place] [address]  Chief Complaint  Patient presents with  . New Patient (Initial Visit)  . Nephrolithiasis    follow-up from ED    HPI: Patient is a 37 year old African American female who is referred by [facility] ED for nephrolithiasis.  She presented to the ED on 05/18/2017 with the complaint of severe right flank pain which she described as throbbing radiating to her right groin associated with nausea.  CT Renal stone study on 05/18/2017 demonstrated that her kidneys are orthotopic, the RIGHT is mildly larger, likely edematous. Mild RIGHT hydronephrosis. 5 mm RIGHT ureteral pelvic junction calculus. 2 mm RIGHT lower pole nephrolithiasis. No LEFT hydronephrosis or nephrolithiasis. RIGHT perinephric fat stranding. Limited assessment for renal masses on this nonenhanced examination. The unopacified ureters are normal in course and caliber. Urinary bladder is partially distended and unremarkable. Normal adrenal glands.  Her creatinine was 0.98.  She was given Zofran, Toradol and Morphine in the ED.  She was discharged with hydrocodone/APAP 5/325, ibuprofen 600 mg, ondansetron 69m ODT and tamsulosin 0.4 mg daily.    Today, she is having left lower quadrant pain.  She has not had any further right flank pain.  She has urinary frequency and nocturia.  These symptoms are baseline.  She is having drops of blood in her urine from time to time.  Patient denies any dysuria or suprapubic/flank pain.  Patient denies any fevers, chills, nausea or vomiting.   Her UA is positive for 6-10 WBC's.    PMH: Past Medical History:  Diagnosis Date  . Diabetes mellitus without complication ([place]   . Genital HSV   . Migraines     Surgical History: Past Surgical History:  Procedure Laterality Date  . ADENOIDECTOMY    . TONSILLECTOMY    . TONSILLECTOMY  1990    Home  Medications:  Allergies as of 05/25/2017      Reactions   Hydrocodone Itching   Imitrex [sumatriptan]    Pork-derived Products       Medication List        Accurate as of 05/25/17  4:16 PM. Always use your most recent med list.          diphenhydrAMINE 12.5 MG chewable tablet Commonly known as:  BENADRYL Chew 12.5 mg by mouth 4 (four) times daily as needed for allergies.   HYDROcodone-acetaminophen 5-325 MG tablet Commonly known as:  NORCO Take 1 tablet by mouth every 6 (six) hours as needed for up to 15 doses for severe pain.   ibuprofen 600 MG tablet Commonly known as:  ADVIL,MOTRIN Take 1 tablet (600 mg total) by mouth every 8 (eight) hours as needed.   metFORMIN 500 MG tablet Commonly known as:  GLUCOPHAGE Take 500 mg by mouth.   Norgestimate-Ethinyl Estradiol Triphasic 0.18/0.215/0.25 MG-25 MCG tab Commonly known as:  ORTHO TRI-CYCLEN LO Take 1 tablet by mouth daily.   ondansetron 4 MG disintegrating tablet Commonly known as:  ZOFRAN ODT Take 1 tablet (4 mg total) by mouth every 8 (eight) hours as needed for nausea or vomiting.   tamsulosin 0.4 MG Caps capsule Commonly known as:  FLOMAX Take 1 capsule (0.4 mg total) by mouth daily.       Allergies:  Allergies  Allergen Reactions  . Hydrocodone Itching  . Imitrex [Sumatriptan]   . Pork-Derived Products     Family History: Family  History  Problem Relation Age of Onset  . Hypertension Mother   . Heart disease Mother   . Diabetes Maternal Grandmother   . Hypertension Maternal Grandmother     Social History:  reports that she has never smoked. She has never used smokeless tobacco. She reports that she drinks alcohol. She reports that she does not use drugs.  ROS: UROLOGY Frequent Urination?: Yes Hard to postpone urination?: No Burning/pain with urination?: No Get up at night to urinate?: Yes Leakage of urine?: No Urine stream starts and stops?: No Trouble starting stream?: No Do you have to  strain to urinate?: No Blood in urine?: Yes Urinary tract infection?: No Sexually transmitted disease?: No Injury to kidneys or bladder?: No Painful intercourse?: No Weak stream?: No Currently pregnant?: No Vaginal bleeding?: No Last menstrual period?: 05/04/2017  Gastrointestinal Nausea?: No Vomiting?: No Indigestion/heartburn?: No Diarrhea?: No Constipation?: No  Constitutional Fever: No Night sweats?: No Weight loss?: No Fatigue?: No  Skin Skin rash/lesions?: No Itching?: No  Eyes Blurred vision?: No Double vision?: No  Ears/Nose/Throat Sore throat?: No Sinus problems?: No  Hematologic/Lymphatic Swollen glands?: No Easy bruising?: No  Cardiovascular Leg swelling?: No Chest pain?: No  Respiratory Cough?: No Shortness of breath?: No  Endocrine Excessive thirst?: No  Musculoskeletal Back pain?: No Joint pain?: No  Neurological Headaches?: No Dizziness?: No  Psychologic Depression?: No Anxiety?: No  Physical Exam: BP (!) 149/94   Pulse 82   Ht 5' 9"  (1.753 m)   Wt 271 lb (122.9 kg)   LMP 05/08/2017 (Exact Date) Comment: neg preg test  BMI 40.02 kg/m   Constitutional: Well nourished. Alert and oriented, No acute distress. HEENT: [place] AT, moist mucus membranes. Trachea midline, no masses. Cardiovascular: No clubbing, cyanosis, or edema. Respiratory: Normal respiratory effort, no increased work of breathing. GI: Abdomen is soft, non tender, non distended, no abdominal masses. Liver and spleen not palpable.  No hernias appreciated.  Stool sample for occult testing is not indicated.   GU: No CVA tenderness.  No bladder fullness or masses.   Skin: No rashes, bruises or suspicious lesions. Lymph: No cervical or inguinal adenopathy. Neurologic: Grossly intact, no focal deficits, moving all 4 extremities. Psychiatric: Normal mood and affect.  Laboratory Data: Lab Results  Component Value Date   WBC 11.4 (H) 09/23/2013   HGB 9.5 (L)  09/23/2013   HCT 30.7 (L) 09/23/2013   MCV 87.7 09/23/2013   PLT 171 09/23/2013    Lab Results  Component Value Date   CREATININE 0.98 05/18/2017    No results found for: PSA  No results found for: TESTOSTERONE  No results found for: HGBA1C  No results found for: TSH  No results found for: CHOL, HDL, CHOLHDL, VLDL, LDLCALC  Lab Results  Component Value Date   AST 21 09/22/2013   Lab Results  Component Value Date   ALT 12 09/22/2013   No components found for: ALKALINEPHOPHATASE No components found for: BILIRUBINTOTAL  No results found for: ESTRADIOL  Urinalysis See Epic.   I have reviewed the labs.   Pertinent Imaging: CLINICAL DATA:  RIGHT flank pain beginning at 1 a.m. tonight. Hematuria for a few days. Vomiting.  EXAM: CT ABDOMEN AND PELVIS WITHOUT CONTRAST  TECHNIQUE: Multidetector CT imaging of the abdomen and pelvis was performed following the standard protocol without IV contrast.  COMPARISON:  None.  FINDINGS: LOWER CHEST: 3 mm subsolid RIGHT lower lobe pulmonary nodule, no routine indicated follow-up by consensus. Heart size is upper limits of  normal in size. No pericardial effusion.  HEPATOBILIARY: Diffusely hypodense liver compatible with steatosis, mild focal fatty sparing about the gallbladder fossa. Normal gallbladder.  PANCREAS: Normal.  SPLEEN: Normal.  ADRENALS/URINARY TRACT: Kidneys are orthotopic, the RIGHT is mildly larger, likely edematous. Mild RIGHT hydronephrosis. 5 mm RIGHT ureteral pelvic junction calculus. 2 mm RIGHT lower pole nephrolithiasis. No LEFT hydronephrosis or nephrolithiasis. RIGHT perinephric fat stranding. Limited assessment for renal masses on this nonenhanced examination. The unopacified ureters are normal in course and caliber. Urinary bladder is partially distended and unremarkable. Normal adrenal glands.  STOMACH/BOWEL: The stomach, small and large bowel are normal in course and caliber  without inflammatory changes, sensitivity decreased by lack of enteric contrast. Mild colonic diverticulosis. Normal appendix.  VASCULAR/LYMPHATIC: Aortoiliac vessels are normal in course and caliber. No lymphadenopathy by CT size criteria.  REPRODUCTIVE: Lobulated uterine contour most consistent with leiomyomas including 3.9 x 4.2 cm LEFT probable subserosal leiomyoma.  OTHER: No intraperitoneal free fluid or free air. Phleboliths in the pelvis.  MUSCULOSKELETAL: Non-acute. Small fat containing umbilical hernia. Moderate RIGHT sacroiliac osteoarthrosis. Mild L5-S1 degenerative disc with endplate spurring resulting in moderate to severe LEFT L5-S1 neural foraminal narrowing.  IMPRESSION: 1. 5 mm RIGHT ureteropelvic junction wall calculus resulting in mild hydronephrosis. 2. Residual RIGHT nephrolithiasis measuring to 2 mm. 3. Hepatic steatosis.   Electronically Signed   By: Elon Alas M.D.   On: 05/18/2017 06:09  I have independently reviewed the films.    Assessment & Plan:    1. Right ureteral stone  - explained to the patient that AUA Guidelines for patients with uncomplicated ureteral stones ?10 mm should be offered observation, and those with distal stones of similar size should be offered MET with ?-blockers  - if after 4 to 6 weeks observation with or without MET is not successful we would pursue URS or ESWL, if the patient/clinician decide to intervene sooner based on a shared decision making approach, the clinicians should offer definitive stone treatment  -URS would be the first lined therapy if stone(s) do not pass, but ESWL is the procedure with the least morbidity and lowest complication rate, but URS has a greater stone-free rate in a single procedure  - she would like to continue with MET - tamsulosin 0.4 mg daily is refilled and a new strainer is given - she will bring in any fragments that she passes  - she will RTC in one week for KUB and office  visit  - Advised to contact our office or seek treatment in the ED if becomes febrile or pain/ vomiting are difficult control in order to arrange for emergent/urgent intervention  2. Right hydronephrosis  - obtain RUS to ensure the hydronephrosis has resolved once stone has passed  3. Gross hematuria  - UA today demonstrates no hematuria  - continue to monitor the patient's UA after the treatment/passage of the stone to ensure the hematuria has resolved  - if hematuria persists, we will pursue a hematuria workup with CT Urogram and cystoscopy if appropriate.   Return in about 1 week (around 06/01/2017) for KUB and office visit .  These notes generated with voice recognition software. I apologize for typographical errors.  Zara Council, [facility] [address], [place], [place] [address] [phone]

## 2017-05-25 ENCOUNTER — Ambulatory Visit: Payer: Self-pay | Admitting: Urology

## 2017-05-25 ENCOUNTER — Ambulatory Visit (INDEPENDENT_AMBULATORY_CARE_PROVIDER_SITE_OTHER): Payer: Managed Care, Other (non HMO) | Admitting: Urology

## 2017-05-25 ENCOUNTER — Encounter: Payer: Managed Care, Other (non HMO) | Attending: Family Medicine | Admitting: Registered"

## 2017-05-25 ENCOUNTER — Encounter: Payer: Self-pay | Admitting: Registered"

## 2017-05-25 ENCOUNTER — Encounter: Payer: Self-pay | Admitting: Urology

## 2017-05-25 VITALS — BP 149/94 | HR 82 | Ht 69.0 in | Wt 271.0 lb

## 2017-05-25 DIAGNOSIS — N201 Calculus of ureter: Secondary | ICD-10-CM | POA: Diagnosis not present

## 2017-05-25 DIAGNOSIS — Z713 Dietary counseling and surveillance: Secondary | ICD-10-CM | POA: Insufficient documentation

## 2017-05-25 DIAGNOSIS — E119 Type 2 diabetes mellitus without complications: Secondary | ICD-10-CM | POA: Insufficient documentation

## 2017-05-25 DIAGNOSIS — N132 Hydronephrosis with renal and ureteral calculous obstruction: Secondary | ICD-10-CM | POA: Diagnosis not present

## 2017-05-25 DIAGNOSIS — Z119 Encounter for screening for infectious and parasitic diseases, unspecified: Secondary | ICD-10-CM | POA: Diagnosis present

## 2017-05-25 LAB — URINALYSIS, COMPLETE
BILIRUBIN UA: NEGATIVE
Glucose, UA: NEGATIVE
KETONES UA: NEGATIVE
Leukocytes, UA: NEGATIVE
Nitrite, UA: NEGATIVE
Protein, UA: NEGATIVE
SPEC GRAV UA: 1.025 (ref 1.005–1.030)
Urobilinogen, Ur: 0.2 mg/dL (ref 0.2–1.0)
pH, UA: 5 (ref 5.0–7.5)

## 2017-05-25 LAB — MICROSCOPIC EXAMINATION

## 2017-05-25 MED ORDER — TAMSULOSIN HCL 0.4 MG PO CAPS: 0.4000 mg | ORAL_CAPSULE | Freq: Every day | ORAL | 0 refills | Status: DC

## 2017-05-25 NOTE — Patient Instructions (Addendum)
Make sure to get in three meals per day. Try to have balanced meals like the My Plate example (see handout).  Include a consistent amount of carbohydrates at each meal. Recommend including 2-3 carbohydrate choices at each meal (see handout).   Recommend discussing checking blood sugar at home with your physician.   Include less saturated fats and include heart healthy fats instead (see handout).   Continue staying well hydrated. Great job with water intake!   Make physical activity a part of your week. Try to include at least 30 minutes of physical activity 5 days each week or at least 150 minutes per week. Regular physical activity promotes overall health-including helping to reduce risk for heart disease, promoting blood sugar management, mental health, and helping us sleep better.

## 2017-05-25 NOTE — Progress Notes (Signed)
Diabetes Self-Management Education  Visit Type: First/Initial  Appt. Start Time: 0910 Appt. End Time: 1040  05/25/2017  [name], identified by name and date of birth, is a 37 y.o. female with a diagnosis of Diabetes: Type 2.   ASSESSMENT  Weight 271 lb 12.8 oz (123.3 kg), last menstrual period 05/08/2017. Body mass index is 41.94 kg/m.   Pt reports she has been trying to follow a low sugar diet. She reports she has been trying to limit herself to 24 g of sugar per day and has been feeling like her blood sugar is too low (dizzy, nauseous per pt). Pt reports having elevated cholesterol levels.   Diabetes Self-Management Education - 05/25/17 0919      Visit Information   Visit Type  First/Initial      Initial Visit   Diabetes Type  Type 2    Are you currently following a meal plan?  Yes    What type of meal plan do you follow?  Low sugar.  Pt reports she has made several dietary changes including cutting out sodas and red meat.     Are you taking your medications as prescribed?  Yes    Date Diagnosed  05/22/17      Health Coping   How would you rate your overall health?  Good      Psychosocial Assessment   Patient Belief/Attitude about Diabetes  Motivated to manage diabetes Pt reports that she feels all of the given options in some capacity.      Self-care barriers  Unable to determine    Self-management support  Family    Other persons present  Patient    Patient Concerns  Glycemic Control;Healthy Lifestyle;Nutrition/Meal planning    Special Needs  None    Preferred Learning Style  No preference indicated    Learning Readiness  Ready    How often do you need to have someone help you when you read instructions, pamphlets, or other written materials from your doctor or pharmacy?  1 - Never    What is the last grade level you completed in school?  BA Degree.       Pre-Education Assessment   Patient understands the diabetes disease and treatment process.  Needs  Instruction    Patient understands incorporating nutritional management into lifestyle.  Needs Instruction    Patient undertands incorporating physical activity into lifestyle.  Needs Instruction    Patient understands using medications safely.  Demonstrates understanding / competency    Patient understands monitoring blood glucose, interpreting and using results  Needs Instruction    Patient understands prevention, detection, and treatment of acute complications.  Needs Instruction    Patient understands prevention, detection, and treatment of chronic complications.  Needs Instruction    Patient understands how to develop strategies to address psychosocial issues.  Needs Instruction    Patient understands how to develop strategies to promote health/change behavior.  Needs Instruction      Complications   Last HgB A1C per patient/outside source  9.8 %    How often do you check your blood sugar?  0 times/day (not testing) Pt reports she has talked to her MD about SMBG and she has not been instructed to check at home at this time.     Number of hypoglycemic episodes per month  20 Pt reports she has felt dizzy and nauseous several times since she started trying to consume a low sugar diet.     Can you tell when  your blood sugar is low?  Yes    What do you do if your blood sugar is low?  Pt reports she chews gum if feeling nauseous.  Pt reports feeling dizzy and nauseaous, but has not been checking BG at home to know what level her BG is at.    Number of hyperglycemic episodes per week  -- Pt unsure as she is not checking blood sugar at home. Pt does reports increased urination and blurry vision at times.    Have you had a dilated eye exam in the past 12 months?  No    Have you had a dental exam in the past 12 months?  Yes    Are you checking your feet?  Yes    How many days per week are you checking your feet?  7      Dietary Intake   Breakfast  (10:30) 2 boiled eggs, tuna, water    Lunch   (12:30 PM) 1 piece white bread toasted, water    Snack (afternoon)  (5:30 PM) handful of walnuts, water     Dinner  (7 PM) 3 crab legs, sweet baked potato, water    Beverage(s)  8-9 16 oz bottles of water typically      Exercise   Exercise Type  Moderate (swimming / aerobic walking);Light (walking / raking leaves)    How many days per week to you exercise?  7    How many minutes per day do you exercise?  15    Total minutes per week of exercise  105      Patient Education   Previous Diabetes Education  No    Disease state   Definition of diabetes, type 1 and 2, and the diagnosis of diabetes;Factors that contribute to the development of diabetes    Nutrition management   Role of diet in the treatment of diabetes and the relationship between the three main macronutrients and blood glucose level;Food label reading, portion sizes and measuring food.;Carbohydrate counting;Meal options for control of blood glucose level and chronic complications.;Effects of alcohol on blood glucose and safety factors with consumption of alcohol.;Reviewed blood glucose goals for pre and post meals and how to evaluate the patients' food intake on their blood glucose level.    Physical activity and exercise   Role of exercise on diabetes management, blood pressure control and cardiac health.    Monitoring  Purpose and frequency of SMBG.;Interpreting lab values - A1C, lipid, urine microalbumina.;Yearly dilated eye exam;Daily foot exams;Identified appropriate SMBG and/or A1C goals.;Taught/discussed recording of test results and interpretation of SMBG.    Acute complications  Taught treatment of hypoglycemia - the 15 rule.;Covered sick day management with medication and food.;Discussed and identified patients' treatment of hyperglycemia.    Chronic complications  Relationship between chronic complications and blood glucose control;Lipid levels, blood glucose control and heart disease;Dental care;Reviewed with patient heart  disease, higher risk of, and prevention;Retinopathy and reason for yearly dilated eye exams;Assessed and discussed foot care and prevention of foot problems;Identified and discussed with patient  current chronic complications    Psychosocial adjustment  Role of stress on diabetes;Travel strategies;Identified and addressed patients feelings and concerns about diabetes      Individualized Goals (developed by patient)   Nutrition  Follow meal plan discussed;General guidelines for healthy choices and portions discussed    Physical Activity  30 minutes per day;Exercise 3-5 times per week    Medications  take my medication as prescribed    Monitoring   --  Encouraged SMBG. Recommended pt discuss doing so with her MD.       Post-Education Assessment   Patient understands the diabetes disease and treatment process.  Demonstrates understanding / competency    Patient understands incorporating nutritional management into lifestyle.  Demonstrates understanding / competency    Patient undertands incorporating physical activity into lifestyle.  Demonstrates understanding / competency    Patient understands using medications safely.  Demonstrates understanding / competency    Patient understands monitoring blood glucose, interpreting and using results  Demonstrates understanding / competency    Patient understands prevention, detection, and treatment of acute complications.  Demonstrates understanding / competency    Patient understands prevention, detection, and treatment of chronic complications.  Demonstrates understanding / competency    Patient understands how to develop strategies to address psychosocial issues.  Demonstrates understanding / competency      Outcomes   Expected Outcomes  Demonstrated interest in learning. Expect positive outcomes    Future DMSE  PRN    Program Status  Completed       Individualized Plan for Diabetes Self-Management Training:   Learning Objective:  Patient will have  a greater understanding of diabetes self-management. Patient education plan is to attend individual and/or group sessions per assessed needs and concerns.   Plan:   Patient Instructions  Make sure to get in three meals per day. Try to have balanced meals like the My Plate example (see handout).  Include a consistent amount of carbohydrates at each meal. Recommend including 2-3 carbohydrate choices at each meal (see handout).   Recommend discussing checking blood sugar at home with your physician.   Include less saturated fats and include heart healthy fats instead (see handout).   Continue staying well hydrated. Great job with water intake!   Make physical activity a part of your week. Try to include at least 30 minutes of physical activity 5 days each week or at least 150 minutes per week. Regular physical activity promotes overall health-including helping to reduce risk for heart disease, promoting blood sugar management, mental health, and helping [place] sleep better.      Expected Outcomes:  Demonstrated interest in learning. Expect positive outcomes  Education material provided: Living Well with Diabetes, Meal plan card, My Plate and Snack sheet, Types of Fats handout   If problems or questions, patient to contact team via:  Phone and Email  Future DSME appointment: PRN

## 2017-05-26 ENCOUNTER — Other Ambulatory Visit: Payer: Self-pay

## 2017-05-26 DIAGNOSIS — N201 Calculus of ureter: Secondary | ICD-10-CM

## 2017-05-26 LAB — CBC WITH DIFFERENTIAL/PLATELET
BASOS: 1 %
Basophils Absolute: 0 10*3/uL (ref 0.0–0.2)
EOS (ABSOLUTE): 0.1 10*3/uL (ref 0.0–0.4)
Eos: 2 %
Hematocrit: 40.2 % (ref 34.0–46.6)
Hemoglobin: 12.9 g/dL (ref 11.1–15.9)
Immature Grans (Abs): 0 10*3/uL (ref 0.0–0.1)
Immature Granulocytes: 0 %
LYMPHS: 44 %
Lymphocytes Absolute: 3.1 10*3/uL (ref 0.7–3.1)
MCH: 26.8 pg (ref 26.6–33.0)
MCHC: 32.1 g/dL (ref 31.5–35.7)
MCV: 84 fL (ref 79–97)
Monocytes Absolute: 0.5 10*3/uL (ref 0.1–0.9)
Monocytes: 7 %
NEUTROS ABS: 3.2 10*3/uL (ref 1.4–7.0)
Neutrophils: 46 %
Platelets: 341 10*3/uL (ref 150–379)
RBC: 4.81 x10E6/uL (ref 3.77–5.28)
RDW: 13.5 % (ref 12.3–15.4)
WBC: 7 10*3/uL (ref 3.4–10.8)

## 2017-05-26 LAB — HCG, SERUM, QUALITATIVE: hCG,Beta Subunit,Qual,Serum: NEGATIVE m[IU]/mL (ref <6)

## 2017-05-26 MED ORDER — TAMSULOSIN HCL 0.4 MG PO CAPS: 0.4000 mg | ORAL_CAPSULE | Freq: Every day | ORAL | 0 refills | Status: DC

## 2017-05-29 LAB — CULTURE, URINE COMPREHENSIVE

## 2017-05-29 NOTE — Progress Notes (Deleted)
06/01/2017 2:10 PM   Miranda Luna May 19, 1980 [id number]  Referring provider: Damaris Hippo, [facility], [place] [address]  No chief complaint on file.   HPI: Patient is a 37 year old African American female who presents today for a one week follow up for a 5 mm right UPJ stone.   Background history Patient is a 37 year old African American female who is referred by [facility] ED for nephrolithiasis.  She presented to the ED on 05/18/2017 with the complaint of severe right flank pain which she described as throbbing radiating to her right groin associated with nausea.  CT Renal stone study on 05/18/2017 demonstrated that her kidneys are orthotopic, the RIGHT is mildly larger, likely edematous. Mild RIGHT hydronephrosis. 5 mm RIGHT ureteral pelvic junction calculus. 2 mm RIGHT lower pole nephrolithiasis. No LEFT hydronephrosis or nephrolithiasis. RIGHT perinephric fat stranding. Limited assessment for renal masses on this nonenhanced examination. The unopacified ureters are normal in course and caliber. Urinary bladder is partially distended and unremarkable. Normal adrenal glands.  Her creatinine was 0.98.  She was given Zofran, Toradol and Morphine in the ED.  She was discharged with hydrocodone/APAP 5/325, ibuprofen 600 mg, ondansetron 3m ODT and tamsulosin 0.4 mg daily.    Today, ***.   Her KUB is ***.  Her UA is ***.     PMH: Past Medical History:  Diagnosis Date  . Diabetes mellitus without complication ([place]   . Genital HSV   . Migraines     Surgical History: Past Surgical History:  Procedure Laterality Date  . ADENOIDECTOMY    . TONSILLECTOMY    . TONSILLECTOMY  1990    Home Medications:  Allergies as of 06/01/2017      Reactions   Hydrocodone Itching   Imitrex [sumatriptan]    Pork-derived Products       Medication List        Accurate as of 05/29/17  2:10 PM. Always use your most recent med list.          diphenhydrAMINE 12.5 MG  chewable tablet Commonly known as:  BENADRYL Chew 12.5 mg by mouth 4 (four) times daily as needed for allergies.   HYDROcodone-acetaminophen 5-325 MG tablet Commonly known as:  NORCO Take 1 tablet by mouth every 6 (six) hours as needed for up to 15 doses for severe pain.   ibuprofen 600 MG tablet Commonly known as:  ADVIL,MOTRIN Take 1 tablet (600 mg total) by mouth every 8 (eight) hours as needed.   metFORMIN 500 MG tablet Commonly known as:  GLUCOPHAGE Take 500 mg by mouth.   Norgestimate-Ethinyl Estradiol Triphasic 0.18/0.215/0.25 MG-25 MCG tab Commonly known as:  ORTHO TRI-CYCLEN LO Take 1 tablet by mouth daily.   ondansetron 4 MG disintegrating tablet Commonly known as:  ZOFRAN ODT Take 1 tablet (4 mg total) by mouth every 8 (eight) hours as needed for nausea or vomiting.   tamsulosin 0.4 MG Caps capsule Commonly known as:  FLOMAX Take 1 capsule (0.4 mg total) by mouth daily.       Allergies:  Allergies  Allergen Reactions  . Hydrocodone Itching  . Imitrex [Sumatriptan]   . Pork-Derived Products     Family History: Family History  Problem Relation Age of Onset  . Hypertension Mother   . Heart disease Mother   . Diabetes Maternal Grandmother   . Hypertension Maternal Grandmother     Social History:  reports that she has never smoked. She has  never used smokeless tobacco. She reports that she drinks alcohol. She reports that she does not use drugs.  ROS:                                        Physical Exam: LMP 05/08/2017 (Exact Date) Comment: neg preg test  Constitutional: Well nourished. Alert and oriented, No acute distress. HEENT: [place] AT, moist mucus membranes. Trachea midline, no masses. Cardiovascular: No clubbing, cyanosis, or edema. Respiratory: Normal respiratory effort, no increased work of breathing. GI: Abdomen is soft, non tender, non distended, no abdominal masses. Liver and spleen not palpable.  No hernias  appreciated.  Stool sample for occult testing is not indicated.   GU: No CVA tenderness.  No bladder fullness or masses.   Skin: No rashes, bruises or suspicious lesions. Lymph: No cervical or inguinal adenopathy. Neurologic: Grossly intact, no focal deficits, moving all 4 extremities. Psychiatric: Normal mood and affect.  Laboratory Data: Lab Results  Component Value Date   WBC 7.0 05/25/2017   HGB 12.9 05/25/2017   HCT 40.2 05/25/2017   MCV 84 05/25/2017   PLT 341 05/25/2017    Lab Results  Component Value Date   CREATININE 0.98 05/18/2017    No results found for: PSA  No results found for: TESTOSTERONE  No results found for: HGBA1C  No results found for: TSH  No results found for: CHOL, HDL, CHOLHDL, VLDL, LDLCALC  Lab Results  Component Value Date   AST 21 09/22/2013   Lab Results  Component Value Date   ALT 12 09/22/2013   No components found for: ALKALINEPHOPHATASE No components found for: BILIRUBINTOTAL  No results found for: ESTRADIOL  Urinalysis See Epic.   I have reviewed the labs.   Pertinent Imaging: *** I have independently reviewed the films.    Assessment & Plan:    1. Right ureteral stone  - explained to the patient that AUA Guidelines for patients with uncomplicated ureteral stones ?10 mm should be offered observation, and those with distal stones of similar size should be offered MET with ?-blockers  - if after 4 to 6 weeks observation with or without MET is not successful we would pursue URS or ESWL, if the patient/clinician decide to intervene sooner based on a shared decision making approach, the clinicians should offer definitive stone treatment  -URS would be the first lined therapy if stone(s) do not pass, but ESWL is the procedure with the least morbidity and lowest complication rate, but URS has a greater stone-free rate in a single procedure  - she would like to continue with MET - tamsulosin 0.4 mg daily is refilled and a new  strainer is given - she will bring in any fragments that she passes  - she will RTC in one week for KUB and office visit  - Advised to contact our office or seek treatment in the ED if becomes febrile or pain/ vomiting are difficult control in order to arrange for emergent/urgent intervention  2. Right hydronephrosis  - obtain RUS to ensure the hydronephrosis has resolved once stone has passed  3. Gross hematuria  - UA today demonstrates no hematuria  - continue to monitor the patient's UA after the treatment/passage of the stone to ensure the hematuria has resolved  - if hematuria persists, we will pursue a hematuria workup with CT Urogram and cystoscopy if appropriate.   No follow-ups on  file.  These notes generated with voice recognition software. I apologize for typographical errors.  Zara Council, PA-C  [facility] [address] [place], [place] [address] [phone]

## 2017-06-01 ENCOUNTER — Ambulatory Visit: Payer: Managed Care, Other (non HMO) | Admitting: Urology

## 2017-06-01 ENCOUNTER — Encounter: Payer: Self-pay | Admitting: Urology

## 2017-06-04 ENCOUNTER — Other Ambulatory Visit: Payer: Self-pay | Admitting: Urology

## 2017-06-04 ENCOUNTER — Ambulatory Visit
Admission: RE | Admit: 2017-06-04 | Discharge: 2017-06-04 | Disposition: A | Discharge status: Home / Self Care | Payer: Managed Care, Other (non HMO) | Source: Ambulatory Visit | Attending: Urology | Admitting: Urology

## 2017-06-04 ENCOUNTER — Ambulatory Visit (INDEPENDENT_AMBULATORY_CARE_PROVIDER_SITE_OTHER): Payer: Managed Care, Other (non HMO) | Admitting: Urology

## 2017-06-04 ENCOUNTER — Encounter: Payer: Self-pay | Admitting: Urology

## 2017-06-04 VITALS — BP 141/90 | HR 81 | Ht 69.0 in | Wt 273.6 lb

## 2017-06-04 DIAGNOSIS — N201 Calculus of ureter: Secondary | ICD-10-CM

## 2017-06-04 LAB — URINALYSIS, COMPLETE
BILIRUBIN UA: NEGATIVE
GLUCOSE, UA: NEGATIVE
Ketones, UA: NEGATIVE
Leukocytes, UA: NEGATIVE
Nitrite, UA: NEGATIVE
PROTEIN UA: NEGATIVE
Specific Gravity, UA: 1.025 (ref 1.005–1.030)
Urobilinogen, Ur: 0.2 mg/dL (ref 0.2–1.0)
pH, UA: 5.5 (ref 5.0–7.5)

## 2017-06-04 LAB — MICROSCOPIC EXAMINATION: Epithelial Cells (non renal): NONE SEEN /hpf (ref 0–10)

## 2017-06-04 MED ORDER — TAMSULOSIN HCL 0.4 MG PO CAPS: ORAL_CAPSULE | ORAL | 3 refills | Status: DC

## 2017-06-04 NOTE — Progress Notes (Signed)
Flomax 0.4 mg bid sent to pharmacy.

## 2017-06-04 NOTE — Progress Notes (Signed)
06/04/2017 2:31 PM   Miranda Luna [birth year] [id number]  Referring provider: Damaris Hippo, [place], [place] [address]  Chief Complaint  Patient presents with  . Follow-up    KUB results    HPI: 37 year old African-American female who is seen today in follow-up for a right proximal ureteral stone.  The patient was last seen approximately 10 days ago and was started on medical expulsion therapy.  Since that time, she has had ongoing nausea which is been persistent.  She has some minimal pressure and pain.  She has had several bouts of emesis.  She has been taking tamsulosin.  She denies any gross hematuria.  She denies any fevers or chills.   PMH: Past Medical History:  Diagnosis Date  . Diabetes mellitus without complication ([place])   . Genital HSV   . Migraines     Surgical History: Past Surgical History:  Procedure Laterality Date  . ADENOIDECTOMY    . TONSILLECTOMY    . TONSILLECTOMY  1990    Home Medications:  Allergies as of 06/04/2017      Reactions   Hydrocodone Itching   Imitrex [sumatriptan]    Pork-derived Products       Medication List        Accurate as of 06/04/17  2:31 PM. Always use your most recent med list.          diphenhydrAMINE 12.5 MG chewable tablet Commonly known as:  BENADRYL Chew 12.5 mg by mouth 4 (four) times daily as needed for allergies.   HYDROcodone-acetaminophen 5-325 MG tablet Commonly known as:  NORCO Take 1 tablet by mouth every 6 (six) hours as needed for up to 15 doses for severe pain.   ibuprofen 600 MG tablet Commonly known as:  ADVIL,MOTRIN Take 1 tablet (600 mg total) by mouth every 8 (eight) hours as needed.   LO LOESTRIN FE 1 MG-10 MCG / 10 MCG tablet Generic drug:  Norethindrone-Ethinyl Estradiol-Fe Biphas   metFORMIN 500 MG tablet Commonly known as:  GLUCOPHAGE Take 500 mg by mouth.   ondansetron 4 MG disintegrating tablet Commonly known as:  ZOFRAN ODT Take 1 tablet (4 mg  total) by mouth every 8 (eight) hours as needed for nausea or vomiting.   ONE TOUCH ULTRA 2 w/Device Kit   ONE TOUCH ULTRA TEST test strip Generic drug:  glucose blood   ONETOUCH DELICA LANCETS FINE Misc   tamsulosin 0.4 MG Caps capsule Commonly known as:  FLOMAX Take one capsule twice daily       Allergies:  Allergies  Allergen Reactions  . Hydrocodone Itching  . Imitrex [Sumatriptan]   . Pork-Derived Products     Family History: Family History  Problem Relation Age of Onset  . Hypertension Mother   . Heart disease Mother   . Diabetes Maternal Grandmother   . Hypertension Maternal Grandmother     Social History:  reports that she has never smoked. She has never used smokeless tobacco. She reports that she drinks alcohol. She reports that she does not use drugs.  ROS: UROLOGY Frequent Urination?: No Hard to postpone urination?: No Burning/pain with urination?: No Get up at night to urinate?: Yes Leakage of urine?: No Urine stream starts and stops?: Yes Trouble starting stream?: No Do you have to strain to urinate?: No Blood in urine?: Yes Urinary tract infection?: No Sexually transmitted disease?: No Injury to kidneys or bladder?: No Painful intercourse?: No Weak stream?: No Currently pregnant?: No Vaginal bleeding?:  No Last menstrual period?: n  Gastrointestinal Nausea?: Yes Vomiting?: Yes Indigestion/heartburn?: No Diarrhea?: Yes Constipation?: No  Constitutional Fever: No Night sweats?: No Weight loss?: No Fatigue?: Yes  Skin Skin rash/lesions?: No Itching?: No  Eyes Blurred vision?: Yes Double vision?: No  Ears/Nose/Throat Sore throat?: No Sinus problems?: No  Hematologic/Lymphatic Swollen glands?: No Easy bruising?: No  Cardiovascular Leg swelling?: No Chest pain?: Yes  Respiratory Cough?: No Shortness of breath?: Yes  Endocrine Excessive thirst?: No  Musculoskeletal Back pain?: No Joint pain?:  No  Neurological Headaches?: Yes Dizziness?: Yes  Psychologic Depression?: No Anxiety?: No  Physical Exam: BP (!) 141/90 (BP Location: Right Arm, Patient Position: Sitting, Cuff Size: Large)   Pulse 81   Ht _0  (1.753 m)   Wt 124.1 kg (273 lb 9.6 oz)   LMP 05/08/2017 (Exact Date) Comment: neg preg test  BMI 40.40 kg/m   Constitutional:  Alert and oriented, No acute distress. HEENT: [place] AT, moist mucus membranes.  Trachea midline, no masses. Cardiovascular: No clubbing, cyanosis, or edema.  Regular rate and rhythm Respiratory: Normal respiratory effort, no increased work of breathing.  Clear to auscultation bilaterally GI: Abdomen is soft, nontender, nondistended -  mild right-sided CVA tenderness Lymph: No cervical or inguinal lymphadenopathy. Skin: No rashes, bruises or suspicious lesions. Neurologic: Grossly intact, no focal deficits, moving all 4 extremities. Psychiatric: Normal mood and affect.  Laboratory Data: Lab Results  Component Value Date   WBC 7.0 05/25/2017   HGB 12.9 05/25/2017   HCT 40.2 05/25/2017   MCV 84 05/25/2017   PLT 341 05/25/2017    Lab Results  Component Value Date   CREATININE 0.98 05/18/2017    No results found for: PSA  No results found for: TESTOSTERONE  No results found for: HGBA1C  Urinalysis    Component Value Date/Time   COLORURINE YELLOW 09/01/2013 1825   APPEARANCEUR Clear 05/25/2017 1524   LABSPEC 1.010 09/01/2013 1825   PHURINE 6.0 09/01/2013 1825   GLUCOSEU Negative 05/25/2017 1524   HGBUR NEGATIVE 09/01/2013 1825   BILIRUBINUR Negative 05/25/2017 1524   KETONESUR NEGATIVE 09/01/2013 1825   PROTEINUR Negative 05/25/2017 1524   PROTEINUR NEGATIVE 09/01/2013 1825   UROBILINOGEN 0.2 09/01/2013 1825   NITRITE Negative 05/25/2017 1524   NITRITE NEGATIVE 09/01/2013 1825   LEUKOCYTESUR Negative 05/25/2017 1524    Lab Results  Component Value Date   LABMICR See below: 05/25/2017   WBCUA 6-10 (A) 05/25/2017   RBCUA  0-2 05/25/2017   LABEPIT 0-10 05/25/2017   MUCUS Present (A) 05/25/2017   BACTERIA Few (A) 05/25/2017    Pertinent Imaging: I have independently reviewed the patient's KUB which demonstrates a persistent stone in the right proximal ureter.  This is compared to a CAT scan that was performed 05/18/17 demonstrating a similar finding.  I reviewed both and discussed with the patient. Results for orders placed during the hospital encounter of 06/04/17  DG Abd 1 View   Narrative CLINICAL DATA:  Right ureteral calculus  EXAM: ABDOMEN - 1 VIEW  COMPARISON:  CT abdomen and pelvis May 18, 2017  FINDINGS: There is a calcification to the right of L1-2 measuring 6 x 4 mm, an apparent proximal ureteral calculus on the right. There are phleboliths in the pelvis. There is moderate stool in the colon. There is no bowel dilatation or air-fluid level to suggest bowel obstruction. No free air. Lung bases are clear.  IMPRESSION: 6 x 4 mm calcification on the right at L1-2, felt to represent  a proximal ureteral calculus on the right. Apparent phleboliths in the pelvis. No bowel obstruction or free air.   Electronically Signed   By: Lowella Grip III M.D.   On: 06/04/2017 13:05    No results found for this or any previous visit. No results found for this or any previous visit. No results found for this or any previous visit. No results found for this or any previous visit. No results found for this or any previous visit. No results found for this or any previous visit. Results for orders placed during the hospital encounter of 05/18/17  CT Renal Stone Study   Narrative CLINICAL DATA:  RIGHT flank pain beginning at 1 a.m. tonight. Hematuria for a few days. Vomiting.  EXAM: CT ABDOMEN AND PELVIS WITHOUT CONTRAST  TECHNIQUE: Multidetector CT imaging of the abdomen and pelvis was performed following the standard protocol without IV contrast.  COMPARISON:  None.  FINDINGS: LOWER CHEST:  3 mm subsolid RIGHT lower lobe pulmonary nodule, no routine indicated follow-up by consensus. Heart size is upper limits of normal in size. No pericardial effusion.  HEPATOBILIARY: Diffusely hypodense liver compatible with steatosis, mild focal fatty sparing about the gallbladder fossa. Normal gallbladder.  PANCREAS: Normal.  SPLEEN: Normal.  ADRENALS/URINARY TRACT: Kidneys are orthotopic, the RIGHT is mildly larger, likely edematous. Mild RIGHT hydronephrosis. 5 mm RIGHT ureteral pelvic junction calculus. 2 mm RIGHT lower pole nephrolithiasis. No LEFT hydronephrosis or nephrolithiasis. RIGHT perinephric fat stranding. Limited assessment for renal masses on this nonenhanced examination. The unopacified ureters are normal in course and caliber. Urinary bladder is partially distended and unremarkable. Normal adrenal glands.  STOMACH/BOWEL: The stomach, small and large bowel are normal in course and caliber without inflammatory changes, sensitivity decreased by lack of enteric contrast. Mild colonic diverticulosis. Normal appendix.  VASCULAR/LYMPHATIC: Aortoiliac vessels are normal in course and caliber. No lymphadenopathy by CT size criteria.  REPRODUCTIVE: Lobulated uterine contour most consistent with leiomyomas including 3.9 x 4.2 cm LEFT probable subserosal leiomyoma.  OTHER: No intraperitoneal free fluid or free air. Phleboliths in the pelvis.  MUSCULOSKELETAL: Non-acute. Small fat containing umbilical hernia. Moderate RIGHT sacroiliac osteoarthrosis. Mild L5-S1 degenerative disc with endplate spurring resulting in moderate to severe LEFT L5-S1 neural foraminal narrowing.  IMPRESSION: 1. 5 mm RIGHT ureteropelvic junction wall calculus resulting in mild hydronephrosis. 2. Residual RIGHT nephrolithiasis measuring to 2 mm. 3. Hepatic steatosis.   Electronically Signed   By: Elon Alas M.D.   On: 05/18/2017 06:09     Assessment & Plan:    1. Right  ureteral stone The patient has a persistent right proximal ureteral stone measuring approximately 5 mm.  This is clearly evident on her KUB today.  The stone does not appear to have made any progress since her initial CT scan which was performed on 05/18/17.  Fortunately, her pain is relatively well controlled.  However, she does continue to have persistent nausea and vomiting.  There is no evidence of infection and she has not had any fevers or chills.  The patient and I discussed numerous treatment options including ongoing medical expulsion therapy, ureteroscopy and laser lithotripsy with stent placement, and shockwave lithotripsy.  I discussed the pros and the cons of each treatment modality.  Given that her stone is not moved over the past 18 days, I do not think medical expulsion therapy is a good option for her at this time as I do not think the stone is likely to pass in a reasonable amount of time.  Thus, we also discussed ureteroscopy, and I told the patient that following this procedure she has the best chance of being stone free.  However, she understands the postoperative stent and the associated irritative urinary tract symptoms.  We also discussed shockwave lithotripsy, and I told the patient she had a 70% chance of being stone free following this procedure.  I have asked Dr. Bernardo Heater to talk to the patient and together they can decide the most appropriate treatment option, for which he can facilitate. - Urinalysis, Complete   No follow-ups on file.  Ardis Hughs, [facility] [address], [place], [place] [address] [phone]

## 2017-06-15 ENCOUNTER — Ambulatory Visit
Admission: RE | Admit: 2017-06-15 | Discharge: 2017-06-15 | Disposition: A | Discharge status: Home / Self Care | Payer: Managed Care, Other (non HMO) | Source: Ambulatory Visit | Attending: Urology | Admitting: Urology

## 2017-06-15 ENCOUNTER — Ambulatory Visit (INDEPENDENT_AMBULATORY_CARE_PROVIDER_SITE_OTHER): Payer: Managed Care, Other (non HMO) | Admitting: Urology

## 2017-06-15 ENCOUNTER — Encounter: Payer: Self-pay | Admitting: Urology

## 2017-06-15 VITALS — BP 151/94 | HR 90 | Ht 69.0 in | Wt 278.6 lb

## 2017-06-15 DIAGNOSIS — N201 Calculus of ureter: Secondary | ICD-10-CM

## 2017-06-15 DIAGNOSIS — I878 Other specified disorders of veins: Secondary | ICD-10-CM | POA: Insufficient documentation

## 2017-06-15 NOTE — Progress Notes (Signed)
06/15/2017 9:48 AM   Octavio Manns [birth year] [id number]  Referring provider: Damaris Hippo, [place],  [address]  Chief Complaint  Patient presents with  . Nephrolithiasis    HPI: 37 year old female presents for follow-up of a right proximal ureteral calculus. She saw Dr. Louis Meckel on 06/04/2017 and treatment options of ureteroscopy versus shockwave lithotripsy were discussed.  She wanted to think over these options and presents today for follow-up.  Her pain has been minimal since her last visit.  She is still undecided on her choice of treatment.  PMH: Past Medical History:  Diagnosis Date  . Diabetes mellitus without complication ([place])   . Genital HSV   . Migraines     Surgical History: Past Surgical History:  Procedure Laterality Date  . ADENOIDECTOMY    . TONSILLECTOMY    . TONSILLECTOMY  1990    Home Medications:  Allergies as of 06/15/2017      Reactions   Hydrocodone Itching   Imitrex [sumatriptan]    Pork-derived Products       Medication List        Accurate as of 06/15/17  9:48 AM. Always use your most recent med list.          diphenhydrAMINE 12.5 MG chewable tablet Commonly known as:  BENADRYL Chew 12.5 mg by mouth 4 (four) times daily as needed for allergies.   HYDROcodone-acetaminophen 5-325 MG tablet Commonly known as:  NORCO Take 1 tablet by mouth every 6 (six) hours as needed for up to 15 doses for severe pain.   ibuprofen 600 MG tablet Commonly known as:  ADVIL,MOTRIN Take 1 tablet (600 mg total) by mouth every 8 (eight) hours as needed.   LO LOESTRIN FE 1 MG-10 MCG / 10 MCG tablet Generic drug:  Norethindrone-Ethinyl Estradiol-Fe Biphas   metFORMIN 500 MG tablet Commonly known as:  GLUCOPHAGE Take 500 mg by mouth.   ondansetron 4 MG disintegrating tablet Commonly known as:  ZOFRAN ODT Take 1 tablet (4 mg total) by mouth every 8 (eight) hours as needed for nausea or vomiting.   ONE TOUCH ULTRA  2 w/Device Kit   ONE TOUCH ULTRA TEST test strip Generic drug:  glucose blood   ONETOUCH DELICA LANCETS FINE Misc   tamsulosin 0.4 MG Caps capsule Commonly known as:  FLOMAX Take one capsule twice daily       Allergies:  Allergies  Allergen Reactions  . Hydrocodone Itching  . Imitrex [Sumatriptan]   . Pork-Derived Products     Family History: Family History  Problem Relation Age of Onset  . Hypertension Mother   . Heart disease Mother   . Diabetes Maternal Grandmother   . Hypertension Maternal Grandmother     Social History:  reports that she has never smoked. She has never used smokeless tobacco. She reports that she drinks alcohol. She reports that she does not use drugs.  ROS: UROLOGY Frequent Urination?: No Hard to postpone urination?: No Burning/pain with urination?: No Get up at night to urinate?: No Leakage of urine?: No Urine stream starts and stops?: Yes Trouble starting stream?: No Do you have to strain to urinate?: No Blood in urine?: No Urinary tract infection?: No Sexually transmitted disease?: No Injury to kidneys or bladder?: No Painful intercourse?: No Weak stream?: No Currently pregnant?: No Vaginal bleeding?: No Last menstrual period?: n  Gastrointestinal Nausea?: Yes Vomiting?: No Indigestion/heartburn?: No Diarrhea?: Yes  Constitutional Fever: No Night sweats?: No Weight loss?: No Fatigue?:  No  Skin Skin rash/lesions?: No Itching?: No  Eyes Blurred vision?: No Double vision?: No  Ears/Nose/Throat Sore throat?: No Sinus problems?: No  Hematologic/Lymphatic Swollen glands?: No Easy bruising?: No  Cardiovascular Leg swelling?: No Chest pain?: No  Respiratory Cough?: No Shortness of breath?: No  Endocrine Excessive thirst?: No  Musculoskeletal Back pain?: No Joint pain?: No  Neurological Headaches?: No Dizziness?: No  Psychologic Depression?: No Anxiety?: No  Physical Exam: BP (!) 151/94 (BP  Location: Left Arm, Patient Position: Sitting, Cuff Size: Large)   Pulse 90   Ht 5' 9" (1.753 m)   Wt 278 lb 9.6 oz (126.4 kg)   SpO2 99%   BMI 41.14 kg/m   Constitutional:  Alert and oriented, No acute distress. HEENT: [place] AT, moist mucus membranes.  Trachea midline, no masses. Cardiovascular: No clubbing, cyanosis, or edema. RRR Respiratory: Normal respiratory effort, no increased work of breathing.  Lungs clear GI: Abdomen is soft, nontender, nondistended, no abdominal masses GU: No CVA tenderness Lymph: No cervical or inguinal lymphadenopathy. Skin: No rashes, bruises or suspicious lesions. Neurologic: Grossly intact, no focal deficits, moving all 4 extremities. Psychiatric: Normal mood and affect.  Laboratory Data: Lab Results  Component Value Date   WBC 7.0 05/25/2017   HGB 12.9 05/25/2017   HCT 40.2 05/25/2017   MCV 84 05/25/2017   PLT 341 05/25/2017    Lab Results  Component Value Date   CREATININE 0.98 05/18/2017     Pertinent Imaging: Images personally reviewed Results for orders placed during the hospital encounter of 06/04/17  DG Abd 1 View   Narrative CLINICAL DATA:  Right ureteral calculus  EXAM: ABDOMEN - 1 VIEW  COMPARISON:  CT abdomen and pelvis May 18, 2017  FINDINGS: There is a calcification to the right of L1-2 measuring 6 x 4 mm, an apparent proximal ureteral calculus on the right. There are phleboliths in the pelvis. There is moderate stool in the colon. There is no bowel dilatation or air-fluid level to suggest bowel obstruction. No free air. Lung bases are clear.  IMPRESSION: 6 x 4 mm calcification on the right at L1-2, felt to represent a proximal ureteral calculus on the right. Apparent phleboliths in the pelvis. No bowel obstruction or free air.   Electronically Signed   By: Lowella Grip III M.D.   On: 06/04/2017 13:05     Results for orders placed during the hospital encounter of 05/18/17  CT Renal Stone Study    Narrative CLINICAL DATA:  RIGHT flank pain beginning at 1 a.m. tonight. Hematuria for a few days. Vomiting.  EXAM: CT ABDOMEN AND PELVIS WITHOUT CONTRAST  TECHNIQUE: Multidetector CT imaging of the abdomen and pelvis was performed following the standard protocol without IV contrast.  COMPARISON:  None.  FINDINGS: LOWER CHEST: 3 mm subsolid RIGHT lower lobe pulmonary nodule, no routine indicated follow-up by consensus. Heart size is upper limits of normal in size. No pericardial effusion.  HEPATOBILIARY: Diffusely hypodense liver compatible with steatosis, mild focal fatty sparing about the gallbladder fossa. Normal gallbladder.  PANCREAS: Normal.  SPLEEN: Normal.  ADRENALS/URINARY TRACT: Kidneys are orthotopic, the RIGHT is mildly larger, likely edematous. Mild RIGHT hydronephrosis. 5 mm RIGHT ureteral pelvic junction calculus. 2 mm RIGHT lower pole nephrolithiasis. No LEFT hydronephrosis or nephrolithiasis. RIGHT perinephric fat stranding. Limited assessment for renal masses on this nonenhanced examination. The unopacified ureters are normal in course and caliber. Urinary bladder is partially distended and unremarkable. Normal adrenal glands.  STOMACH/BOWEL: The stomach, small and  large bowel are normal in course and caliber without inflammatory changes, sensitivity decreased by lack of enteric contrast. Mild colonic diverticulosis. Normal appendix.  VASCULAR/LYMPHATIC: Aortoiliac vessels are normal in course and caliber. No lymphadenopathy by CT size criteria.  REPRODUCTIVE: Lobulated uterine contour most consistent with leiomyomas including 3.9 x 4.2 cm LEFT probable subserosal leiomyoma.  OTHER: No intraperitoneal free fluid or free air. Phleboliths in the pelvis.  MUSCULOSKELETAL: Non-acute. Small fat containing umbilical hernia. Moderate RIGHT sacroiliac osteoarthrosis. Mild L5-S1 degenerative disc with endplate spurring resulting in moderate to severe  LEFT L5-S1 neural foraminal narrowing.  IMPRESSION: 1. 5 mm RIGHT ureteropelvic junction wall calculus resulting in mild hydronephrosis. 2. Residual RIGHT nephrolithiasis measuring to 2 mm. 3. Hepatic steatosis.   Electronically Signed   By: Elon Alas M.D.   On: 05/18/2017 06:09     Assessment & Plan:   I discussed the pros and cons of shockwave lithotripsy versus ureteroscopy.  Although shockwave lithotripsy is noninvasive and can be done under conscious sedation the success rate is lower as compared to ureteroscopy and is approximately 85% for a stone this size.  The stone density is between 600-800 HU.  The success rate of ureteroscopy was also reviewed however she was informed that in a small percentage of the cases the stone could not be treated due to inability to access the upper ureter with the ureteroscope.  She was hoping she could pass the stone.  I sent her for a KUB to see if there has been any progression since her last x-ray.  She will again think over these options and will be notified with her KUB results.  Abbie Sons, [facility] [address] [place], [place] [address] [phone]

## 2017-06-16 ENCOUNTER — Ambulatory Visit: Payer: Managed Care, Other (non HMO) | Admitting: Urology

## 2017-06-17 ENCOUNTER — Telehealth: Payer: Self-pay

## 2017-06-17 NOTE — Telephone Encounter (Signed)
Called pt no answer °

## 2017-06-17 NOTE — Telephone Encounter (Signed)
-----   Message from Riki Altes, MD sent at 06/17/2017  7:13 AM EDT ----- KUB was reviewed and there has been no significant progression of her right ureteral calculus.

## 2017-06-18 NOTE — Telephone Encounter (Signed)
Pt called requesting to schedule ureteroscopy on 07/17/2017. Will need orders please.

## 2017-07-01 ENCOUNTER — Other Ambulatory Visit: Payer: Self-pay | Admitting: Radiology

## 2017-07-01 DIAGNOSIS — N201 Calculus of ureter: Secondary | ICD-10-CM

## 2017-07-10 ENCOUNTER — Inpatient Hospital Stay: Admission: RE | Admit: 2017-07-10 | Payer: Managed Care, Other (non HMO) | Source: Ambulatory Visit

## 2017-07-10 ENCOUNTER — Other Ambulatory Visit: Payer: Self-pay | Admitting: Urology

## 2017-07-10 ENCOUNTER — Encounter
Admission: RE | Admit: 2017-07-10 | Discharge: 2017-07-10 | Disposition: A | Discharge status: Home / Self Care | Payer: Managed Care, Other (non HMO) | Source: Ambulatory Visit | Attending: Urology | Admitting: Urology

## 2017-07-10 ENCOUNTER — Other Ambulatory Visit: Payer: Self-pay

## 2017-07-10 DIAGNOSIS — Z01818 Encounter for other preprocedural examination: Secondary | ICD-10-CM | POA: Insufficient documentation

## 2017-07-10 HISTORY — DX: Essential (primary) hypertension: I10

## 2017-07-10 HISTORY — DX: Juvenile osteochondrosis of tibia and fibula, right leg: M92.51

## 2017-07-10 HISTORY — DX: Juvenile osteochondrosis of tibia tubercle, bilateral: M92.523

## 2017-07-10 HISTORY — DX: Personal history of urinary calculi: Z87.442

## 2017-07-10 HISTORY — DX: Juvenile osteochondrosis of tibia and fibula, left leg: M92.52

## 2017-07-10 MED ORDER — LACTATED RINGERS IV SOLN: INTRAVENOUS | Status: DC

## 2017-07-10 MED ORDER — HYDROCODONE-ACETAMINOPHEN 5-325 MG PO TABS: 1.0000 | ORAL_TABLET | Freq: Four times a day (QID) | ORAL | 0 refills | Status: DC | PRN

## 2017-07-10 NOTE — Patient Instructions (Signed)
Your procedure is scheduled on: Friday, Jul 17, 2017  Report to THE SECOND FLOOR OF THE MEDICAL MALL.  DO NOT STOP ON THE FIRST FLOOR TO REGISTER  To find out your arrival time please call [phone] between 1PM - 3PM on Thursday, MAY 30,2019  Remember: Instructions that are not followed completely may result in serious medical risk,  up to and including death, or upon the discretion of your surgeon and anesthesiologist your  surgery may need to be rescheduled.     _X__ 1. Do not eat food after midnight the night before your procedure.                 No gum chewing or hard candies.                   You may drink clear liquids up to 2 hours before you are scheduled to arrive for your surgery                - DO not drink clear liquids within 2 hours of the start of your surgery.                  Clear Liquids include:  water, apple juice without pulp, clear carbohydrate                 drink such as Clearfast of Gatorade, Black Coffee or Tea (Do not add                 anything to coffee or tea).  __X__2.  On the morning of surgery brush your teeth with toothpaste and water,                     you may rinse your mouth with mouthwash if you wish.                          Do not swallow any toothpaste of mouthwash.     _X__ 3.  No Alcohol for 24 hours before or after surgery.   _X__ 4.  Do Not Smoke or use e-cigarettes For 24 Hours Prior to Your Surgery.                 Do not use any chewable tobacco products for at least 6 hours prior to                 surgery.  ____  5.  Bring all medications with you on the day of surgery if instructed.   _X__  6.  Notify your doctor if there is any change in your medical condition      (cold, fever, infections).     Do not wear jewelry, make-up, hairpins, clips or nail polish. Do not wear lotions, powders, or perfumes. You may wear deodorant. Do not shave 48 hours prior to surgery. Men may shave face and  neck. Do not bring valuables to the hospital.    [facility] is not responsible for any belongings or valuables.  Contacts, dentures or bridgework may not be worn into surgery. Leave your suitcase in the car. After surgery it may be brought to your room. For patients admitted to the hospital, discharge time is determined by your treatment team.   Patients discharged the day of surgery will not be allowed to drive home.   Please read over the following fact sheets that you were given:   PREPARING  FOR SURGERY   ____ Take these medicines the morning of surgery with A SIP OF WATER:    1.LOLOESTRIN  2. NORCO WITH BENADRYL, IF NEEDED  3. VISINE EYE DROPS IF NEEDED  4.  5.  6.  ____ Fleet Enema (as directed)   ____ Use CHG Soap as directed  ____ Use inhalers on the day of surgery  __X__ Stop metformin 2 days prior to surgery. LAST DOSE IS ON MAY 28,2019    _X___ Stop ALL ASPIRIN PRODUCTS NOW!!                THIS INCLUDES EXCEDRIN / BC POWDERS / GOODYS POWDER  __X__ Stop Anti-inflammatories NOW!!                THIS INCLUDES IBUPROFEN / MOTRIN / ADVIL / ALEVE   ____ Stop supplements until after surgery.    ____ Bring C-Pap to the hospital.   WEAR COMFORTABLE CLOTHING TO THE HOSPITAL

## 2017-07-11 LAB — URINE CULTURE

## 2017-07-14 ENCOUNTER — Telehealth: Payer: Self-pay | Admitting: Radiology

## 2017-07-14 NOTE — Telephone Encounter (Signed)
Dr Lonna Cobb aware of urine culture results from sample collected 07/10/2017. Order received that urine culture does not need to be repeated.

## 2017-07-17 ENCOUNTER — Ambulatory Visit: Payer: Managed Care, Other (non HMO) | Admitting: Anesthesiology

## 2017-07-17 ENCOUNTER — Ambulatory Visit
Admission: RE | Admit: 2017-07-17 | Discharge: 2017-07-17 | Disposition: A | Discharge status: Home / Self Care | Payer: Managed Care, Other (non HMO) | Source: Ambulatory Visit | Attending: Urology | Admitting: Urology

## 2017-07-17 ENCOUNTER — Encounter
Admission: RE | Disposition: A | Discharge status: Home / Self Care | Payer: Self-pay | Source: Ambulatory Visit | Attending: Urology

## 2017-07-17 ENCOUNTER — Telehealth: Payer: Self-pay | Admitting: Urology

## 2017-07-17 DIAGNOSIS — Z79899 Other long term (current) drug therapy: Secondary | ICD-10-CM | POA: Insufficient documentation

## 2017-07-17 DIAGNOSIS — Z888 Allergy status to other drugs, medicaments and biological substances status: Secondary | ICD-10-CM | POA: Insufficient documentation

## 2017-07-17 DIAGNOSIS — N201 Calculus of ureter: Secondary | ICD-10-CM | POA: Diagnosis not present

## 2017-07-17 DIAGNOSIS — Z885 Allergy status to narcotic agent status: Secondary | ICD-10-CM | POA: Insufficient documentation

## 2017-07-17 DIAGNOSIS — G43909 Migraine, unspecified, not intractable, without status migrainosus: Secondary | ICD-10-CM | POA: Diagnosis not present

## 2017-07-17 DIAGNOSIS — E119 Type 2 diabetes mellitus without complications: Secondary | ICD-10-CM | POA: Insufficient documentation

## 2017-07-17 DIAGNOSIS — Z8249 Family history of ischemic heart disease and other diseases of the circulatory system: Secondary | ICD-10-CM | POA: Insufficient documentation

## 2017-07-17 DIAGNOSIS — Z87442 Personal history of urinary calculi: Secondary | ICD-10-CM | POA: Insufficient documentation

## 2017-07-17 DIAGNOSIS — Z7984 Long term (current) use of oral hypoglycemic drugs: Secondary | ICD-10-CM | POA: Diagnosis not present

## 2017-07-17 DIAGNOSIS — Z91018 Allergy to other foods: Secondary | ICD-10-CM | POA: Diagnosis not present

## 2017-07-17 DIAGNOSIS — I10 Essential (primary) hypertension: Secondary | ICD-10-CM | POA: Insufficient documentation

## 2017-07-17 DIAGNOSIS — Z793 Long term (current) use of hormonal contraceptives: Secondary | ICD-10-CM | POA: Diagnosis not present

## 2017-07-17 HISTORY — PX: CYSTOSCOPY/URETEROSCOPY/HOLMIUM LASER/STENT PLACEMENT: SHX6546

## 2017-07-17 LAB — GLUCOSE, CAPILLARY
GLUCOSE-CAPILLARY: 131 mg/dL — AB (ref 65–99)
GLUCOSE-CAPILLARY: 124 mg/dL — AB (ref 65–99)

## 2017-07-17 SURGERY — CYSTOSCOPY/URETEROSCOPY/HOLMIUM LASER/STENT PLACEMENT
Anesthesia: General | Laterality: Right | Wound class: Clean Contaminated

## 2017-07-17 MED ORDER — SODIUM CHLORIDE 0.9 % IV SOLN
INTRAVENOUS | Status: DC
  Administered 2017-07-17: 07:00:00 via INTRAVENOUS

## 2017-07-17 MED ORDER — HYDROCODONE-ACETAMINOPHEN 5-325 MG PO TABS: 1.0000 | ORAL_TABLET | Freq: Four times a day (QID) | ORAL | 0 refills | Status: DC | PRN

## 2017-07-17 MED ORDER — CEFAZOLIN SODIUM-DEXTROSE 2-4 GM/100ML-% IV SOLN
2.0000 g | INTRAVENOUS | Status: AC
  Administered 2017-07-17: 2 g via INTRAVENOUS

## 2017-07-17 MED ORDER — LIDOCAINE HCL (CARDIAC) PF 100 MG/5ML IV SOSY
PREFILLED_SYRINGE | INTRAVENOUS | Status: DC | PRN
  Administered 2017-07-17: 100 mg via INTRAVENOUS

## 2017-07-17 MED ORDER — LIDOCAINE HCL (PF) 2 % IJ SOLN
INTRAMUSCULAR | Status: AC
  Filled 2017-07-17: qty 10

## 2017-07-17 MED ORDER — GLYCOPYRROLATE 0.2 MG/ML IJ SOLN
INTRAMUSCULAR | Status: DC | PRN
  Administered 2017-07-17: 0.2 mg via INTRAVENOUS

## 2017-07-17 MED ORDER — FAMOTIDINE 20 MG PO TABS
20.0000 mg | ORAL_TABLET | Freq: Once | ORAL | Status: AC
  Administered 2017-07-17: 20 mg via ORAL

## 2017-07-17 MED ORDER — FAMOTIDINE 20 MG PO TABS
ORAL_TABLET | ORAL | Status: AC
Start: 2017-07-17 — End: 2017-07-17
  Administered 2017-07-17: 20 mg via ORAL
  Filled 2017-07-17: qty 1

## 2017-07-17 MED ORDER — FENTANYL CITRATE (PF) 100 MCG/2ML IJ SOLN
25.0000 ug | INTRAMUSCULAR | Status: DC | PRN
  Administered 2017-07-17 (×4): 25 ug via INTRAVENOUS

## 2017-07-17 MED ORDER — MIDAZOLAM HCL 2 MG/2ML IJ SOLN
INTRAMUSCULAR | Status: DC | PRN
  Administered 2017-07-17: 2 mg via INTRAVENOUS

## 2017-07-17 MED ORDER — DEXAMETHASONE SODIUM PHOSPHATE 10 MG/ML IJ SOLN
INTRAMUSCULAR | Status: AC
  Filled 2017-07-17: qty 1

## 2017-07-17 MED ORDER — OXYBUTYNIN CHLORIDE 5 MG PO TABS: ORAL_TABLET | ORAL | 0 refills | Status: AC

## 2017-07-17 MED ORDER — CEFAZOLIN SODIUM-DEXTROSE 2-4 GM/100ML-% IV SOLN
INTRAVENOUS | Status: AC
  Filled 2017-07-17: qty 100

## 2017-07-17 MED ORDER — EPHEDRINE SULFATE 50 MG/ML IJ SOLN
INTRAMUSCULAR | Status: AC
  Filled 2017-07-17: qty 1

## 2017-07-17 MED ORDER — ONDANSETRON HCL 4 MG/2ML IJ SOLN: 4.0000 mg | Freq: Once | INTRAMUSCULAR | Status: DC | PRN

## 2017-07-17 MED ORDER — FENTANYL CITRATE (PF) 100 MCG/2ML IJ SOLN
INTRAMUSCULAR | Status: AC
  Filled 2017-07-17: qty 2

## 2017-07-17 MED ORDER — ONDANSETRON HCL 4 MG/2ML IJ SOLN
INTRAMUSCULAR | Status: AC
  Filled 2017-07-17: qty 2

## 2017-07-17 MED ORDER — FENTANYL CITRATE (PF) 100 MCG/2ML IJ SOLN
INTRAMUSCULAR | Status: DC | PRN
  Administered 2017-07-17 (×4): 25 ug via INTRAVENOUS

## 2017-07-17 MED ORDER — SCOPOLAMINE 1 MG/3DAYS TD PT72
MEDICATED_PATCH | TRANSDERMAL | Status: AC
  Filled 2017-07-17: qty 1

## 2017-07-17 MED ORDER — FENTANYL CITRATE (PF) 100 MCG/2ML IJ SOLN
INTRAMUSCULAR | Status: AC
  Administered 2017-07-17: 25 ug via INTRAVENOUS
  Filled 2017-07-17: qty 2

## 2017-07-17 MED ORDER — MIDAZOLAM HCL 2 MG/2ML IJ SOLN
INTRAMUSCULAR | Status: AC
  Filled 2017-07-17: qty 2

## 2017-07-17 MED ORDER — PROPOFOL 10 MG/ML IV BOLUS
INTRAVENOUS | Status: AC
Start: 2017-07-17 — End: ?
  Filled 2017-07-17: qty 20

## 2017-07-17 MED ORDER — PROPOFOL 10 MG/ML IV BOLUS
INTRAVENOUS | Status: DC | PRN
  Administered 2017-07-17: 150 mg via INTRAVENOUS
  Administered 2017-07-17: 20 mg via INTRAVENOUS

## 2017-07-17 SURGICAL SUPPLY — 29 items
BAG DRAIN CYSTO-URO LG1000N (MISCELLANEOUS) ×3 IMPLANT
BASKET ZERO TIP 1.9FR (BASKET) ×3 IMPLANT
BRUSH SCRUB EZ 1% IODOPHOR (MISCELLANEOUS) ×3 IMPLANT
CATH URETL 5X70 OPEN END (CATHETERS) ×3 IMPLANT
CNTNR SPEC 2.5X3XGRAD LEK (MISCELLANEOUS)
CONRAY 43 FOR UROLOGY 50M (MISCELLANEOUS) ×3 IMPLANT
CONT SPEC 4OZ STER OR WHT (MISCELLANEOUS)
CONTAINER SPEC 2.5X3XGRAD LEK (MISCELLANEOUS) IMPLANT
DRAPE UTILITY 15X26 TOWEL STRL (DRAPES) ×3 IMPLANT
FIBER LASER LITHO 273 (Laser) IMPLANT
GLOVE BIO SURGEON STRL SZ8 (GLOVE) ×3 IMPLANT
GOWN STRL REUS W/ TWL LRG LVL3 (GOWN DISPOSABLE) ×2 IMPLANT
GOWN STRL REUS W/TWL LRG LVL3 (GOWN DISPOSABLE) ×4
GUIDEWIRE GREEN .038 145CM (MISCELLANEOUS) ×3 IMPLANT
INFUSOR MANOMETER BAG 3000ML (MISCELLANEOUS) ×3 IMPLANT
INTRODUCER DILATOR DOUBLE (INTRODUCER) IMPLANT
KIT TURNOVER CYSTO (KITS) ×3 IMPLANT
PACK CYSTO AR (MISCELLANEOUS) ×3 IMPLANT
SENSORWIRE 0.038 NOT ANGLED (WIRE) ×6
SET CYSTO W/LG BORE CLAMP LF (SET/KITS/TRAYS/PACK) ×3 IMPLANT
SHEATH URETERAL 12FRX35CM (MISCELLANEOUS) ×6 IMPLANT
SHEATH URETL 1L 13/15X28 (SHEATH) ×3 IMPLANT
SOL .9 NS 3000ML IRR  AL (IV SOLUTION) ×2
SOL .9 NS 3000ML IRR UROMATIC (IV SOLUTION) ×1 IMPLANT
STENT URET 6FRX24 CONTOUR (STENTS) IMPLANT
STENT URET 6FRX26 CONTOUR (STENTS) ×3 IMPLANT
SURGILUBE 2OZ TUBE FLIPTOP (MISCELLANEOUS) ×3 IMPLANT
WATER STERILE IRR 1000ML POUR (IV SOLUTION) ×3 IMPLANT
WIRE SENSOR 0.038 NOT ANGLED (WIRE) ×2 IMPLANT

## 2017-07-17 NOTE — H&P (Signed)
07/17/2017 7:13 AM   Miranda Luna 28-Feb-1980 [id number]  Referring provider: No referring provider defined for this encounter.  HPI: 37 year old female with a right proximal ureteral calculus who presents for ureteroscopic removal.  She has no complaints.   PMH: Past Medical History:  Diagnosis Date  . Diabetes mellitus without complication ([place])   . Genital HSV   . History of kidney stones   . Hypertension    elevated with pregnancy (eclampsia) only  . Migraines    once every 2 weeks or so. excedrin migraine helps  . Osgood-Schlatter's disease of knees, bilateral    HAS HAD SINCE AGE OF 17    Surgical History: Past Surgical History:  Procedure Laterality Date  . ADENOIDECTOMY    . TONSILLECTOMY    . TONSILLECTOMY  1990  . WISDOM TOOTH EXTRACTION      Home Medications:  diphenhydrAMINE 12.5 MG chewable tablet Commonly known as:  BENADRYL Chew 12.5 mg by mouth 4 (four) times daily as needed for allergies.   HYDROcodone-acetaminophen 5-325 MG tablet Commonly known as:  NORCO Take 1 tablet by mouth every 6 (six) hours as needed for up to 15 doses for severe pain.   ibuprofen 600 MG tablet Commonly known as:  ADVIL,MOTRIN Take 1 tablet (600 mg total) by mouth every 8 (eight) hours as needed.   LO LOESTRIN FE 1 MG-10 MCG / 10 MCG tablet Generic drug:  Norethindrone-Ethinyl Estradiol-Fe Biphas   metFORMIN 500 MG tablet Commonly known as:  GLUCOPHAGE Take 500 mg by mouth.   ondansetron 4 MG disintegrating tablet Commonly known as:  ZOFRAN ODT Take 1 tablet (4 mg total) by mouth every 8 (eight) hours as needed for nausea or vomiting.   ONE TOUCH ULTRA 2 w/Device Kit   ONE TOUCH ULTRA TEST test strip Generic drug:  glucose blood   ONETOUCH DELICA LANCETS FINE Misc   tamsulosin 0.4 MG Caps capsule Commonly known as:  FLOMAX Take one capsule twice daily      Allergies:  Allergies  Allergen Reactions  . Imitrex [Sumatriptan] Nausea And  Vomiting  . Pork-Derived Products Diarrhea    Migraines, cramping   . Acyclovir And Related   . Hydrocodone Itching    Tolerates with benadryl     Family History: Family History  Problem Relation Age of Onset  . Hypertension Mother   . Heart disease Mother   . Diabetes Maternal Grandmother   . Hypertension Maternal Grandmother     Social History:  reports that she has never smoked. She has never used smokeless tobacco. She reports that she drinks alcohol. She reports that she does not use drugs.  ROS: UROLOGY Frequent Urination?: No Hard to postpone urination?: No Burning/pain with urination?: No Get up at night to urinate?: No Leakage of urine?: No Urine stream starts and stops?: Yes Trouble starting stream?: No Do you have to strain to urinate?: No Blood in urine?: No Urinary tract infection?: No Sexually transmitted disease?: No Injury to kidneys or bladder?: No Painful intercourse?: No Weak stream?: No Currently pregnant?: No Vaginal bleeding?: No Last menstrual period?: n  Gastrointestinal Nausea?: Yes Vomiting?: No Indigestion/heartburn?: No Diarrhea?: Yes  Constitutional Fever: No Night sweats?: No Weight loss?: No Fatigue?: No  Skin Skin rash/lesions?: No Itching?: No  Eyes Blurred vision?: No Double vision?: No  Ears/Nose/Throat Sore throat?: No Sinus problems?: No  Hematologic/Lymphatic Swollen glands?: No Easy bruising?: No  Cardiovascular Leg swelling?: No Chest pain?: No  Respiratory Cough?: No Shortness of breath?:  No  Endocrine Excessive thirst?: No  Musculoskeletal Back pain?: No Joint pain?: No  Neurological Headaches?: No Dizziness?: No  Psychologic Depression?: No Anxiety?: No   Physical Exam: BP (!) 151/103   Pulse 75   Temp 97.7 F (36.5 C) (Tympanic)   Resp 16   LMP 07/08/2017 (Exact Date)   SpO2 99%   Constitutional:  Alert and oriented, No acute distress. HEENT: [place] AT, moist mucus  membranes.  Trachea midline, no masses. Cardiovascular: No clubbing, cyanosis, or edema.  RRR Respiratory: Normal respiratory effort, no increased work of breathing.  Lungs clear GI: Abdomen is soft, nontender, nondistended, no abdominal masses GU: No CVA tenderness Lymph: No cervical or inguinal lymphadenopathy. Skin: No rashes, bruises or suspicious lesions. Neurologic: Grossly intact, no focal deficits, moving all 4 extremities. Psychiatric: Normal mood and affect.   Assessment & Plan:   I discussed the pros and cons of shockwave lithotripsy versus ureteroscopy.  Although shockwave lithotripsy is noninvasive and can be done under conscious sedation the success rate is lower as compared to ureteroscopy and is approximately 85% for a stone this size.  The stone density is between 600-800 HU.  The success rate of ureteroscopy was also reviewed however she was informed that in a small percentage of the cases the stone could not be treated due to inability to access the upper ureter with the ureteroscope. Potential complications/side effects were discussed including but not limited to bleeding, infection/sepsis, injury to bladder, ureter, kidney as well as risks of anesthesia.  The need for a postoperative ureteral stent was also discussed.    Abbie Sons, [place] [address], [place], [place] [address] [phone]

## 2017-07-17 NOTE — Anesthesia Procedure Notes (Signed)
Procedure Name: LMA Insertion Performed by: Milagros Reap, CRNA Pre-anesthesia Checklist: Patient identified, Emergency Drugs available, Suction available, Patient being monitored and Timeout performed Patient Re-evaluated:Patient Re-evaluated prior to induction Oxygen Delivery Method: Circle system utilized Preoxygenation: Pre-oxygenation with 100% oxygen Induction Type: IV induction Ventilation: Mask ventilation without difficulty LMA Size: 4.0 Number of attempts: 1 Tube secured with: Tape Dental Injury: Teeth and Oropharynx as per pre-operative assessment

## 2017-07-17 NOTE — Anesthesia Post-op Follow-up Note (Signed)
Anesthesia QCDR form completed.        

## 2017-07-17 NOTE — Discharge Instructions (Signed)

## 2017-07-17 NOTE — Telephone Encounter (Signed)
-----   Message from Riki AltesScott C Stoioff, MD sent at 07/17/2017 12:24 PM EDT ----- Needs appointment for cystoscopy with stent removal in approximately 2 weeks.

## 2017-07-17 NOTE — Anesthesia Postprocedure Evaluation (Signed)
Anesthesia Post Note  Patient: Miranda KraftShelby Luna  Procedure(s) Performed: CYSTOSCOPY/URETEROSCOPY/HOLMIUM LASER/STENT PLACEMENT (Right )  Patient location during evaluation: PACU Anesthesia Type: General Level of consciousness: awake and alert Pain management: pain level controlled Vital Signs Assessment: post-procedure vital signs reviewed and stable Respiratory status: spontaneous breathing, nonlabored ventilation, respiratory function stable and patient connected to nasal cannula oxygen Cardiovascular status: blood pressure returned to baseline and stable Postop Assessment: no apparent nausea or vomiting Anesthetic complications: no     Last Vitals:  Vitals:   07/17/17 0954 07/17/17 1037  BP: (!) 143/83 (!) 147/88  Pulse: 68 71  Resp: 16 16  Temp: (!) 36.4 C 36.6 C  SpO2: 100% 99%    Last Pain:  Vitals:   07/17/17 1037  TempSrc: Temporal  PainSc: 0-No pain                 Adell Koval S

## 2017-07-17 NOTE — Interval H&P Note (Signed)
History and Physical Interval Note:  07/17/2017 7:17 AM  Miranda Luna  has presented today for surgery, with the diagnosis of right ureteral calculus  The various methods of treatment have been discussed with the patient and family. After consideration of risks, benefits and other options for treatment, the patient has consented to  Procedure(s): CYSTOSCOPY/URETEROSCOPY/HOLMIUM LASER/STENT PLACEMENT (Right) as a surgical intervention .  The patient's history has been reviewed, patient examined, no change in status, stable for surgery.  I have reviewed the patient's chart and labs.  Questions were answered to the patient's satisfaction.     Scott C Stoioff

## 2017-07-17 NOTE — Anesthesia Preprocedure Evaluation (Signed)
Anesthesia Evaluation  Patient identified by MRN, date of birth, ID band Patient awake    Reviewed: Allergy & Precautions, NPO status , Patient's Chart, lab work & pertinent test results, reviewed documented beta blocker date and time   Airway Mallampati: III  TM Distance: >3 FB     Dental  (+) Chipped   Pulmonary           Cardiovascular hypertension, Pt. on medications      Neuro/Psych  Headaches,    GI/Hepatic   Endo/Other  diabetes, Type 2  Renal/GU      Musculoskeletal   Abdominal   Peds  Hematology   Anesthesia Other Findings   Reproductive/Obstetrics                             Anesthesia Physical Anesthesia Plan  ASA: III  Anesthesia Plan: General   Post-op Pain Management:    Induction: Intravenous  PONV Risk Score and Plan:   Airway Management Planned: Oral ETT and LMA  Additional Equipment:   Intra-op Plan:   Post-operative Plan:   Informed Consent: I have reviewed the patients History and Physical, chart, labs and discussed the procedure including the risks, benefits and alternatives for the proposed anesthesia with the patient or authorized representative who has indicated his/her understanding and acceptance.     Plan Discussed with: CRNA  Anesthesia Plan Comments:         Anesthesia Quick Evaluation

## 2017-07-17 NOTE — Op Note (Addendum)
Preoperative diagnosis: Right ureteral calculus  Postoperative diagnosis: Right ureteral calculus  Procedure:  1. Cystoscopy 2. Right ureteroscopy and stone removal 3. Ureteroscopic laser lithotripsy 4. Right ureteral stent placement (6 Fr) 24 cm 5. Right retrograde pyelography with interpretation  Surgeon: Lorin PicketScott C. Stoioff, M.D.  Anesthesia: General  Complications: None  Intraoperative findings:  1.  Right retrograde pyelography post procedure showed no filling defects, stone fragments or contrast extravasation  EBL: Minimal  Specimens: 1. Calculus fragments for analysis   Indication: Miranda Luna is a 37 y.o. year old patient with urolithiasis. After reviewing the management options for treatment, the patient elected to proceed with the above surgical procedure(s). We have discussed the potential benefits and risks of the procedure, side effects of the proposed treatment, the likelihood of the patient achieving the goals of the procedure, and any potential problems that might occur during the procedure or recuperation. Informed consent has been obtained.  Description of procedure:  The patient was taken to the operating room and general anesthesia was induced.  The patient was placed in the dorsal lithotomy position, prepped and draped in the usual sterile fashion, and preoperative antibiotics were administered. A preoperative time-out was performed.   A 22 French cystoscope was lubricated and passed under direct vision.  The urethra was normal in appearance.  Panendoscopy was performed and the bladder mucosa showed no erythema, solid or papillary lesions.  Attention was directed to the right ureteral orifice and a 0.038 Sensor wire was then advanced up the ureter into the renal pelvis under fluoroscopic guidance.  A 4.5 Fr semirigid ureteroscope was then advanced into the ureter next to the guidewire and advanced proximally until the calculus was identified.  The stone was  near the UPJ and visualization was suboptimal.  A second guidewire was placed and the semirigid ureteroscope was removed.  A 12/14 French ureteral access sheath was placed over the wire however would not advance beyond the mid ureter.  The inner stylette was advanced over the wire and placed without difficulty.  Placement of the entire sheath was then successful under fluoroscopic guidance.  A digital flexible ureteroscope was then placed through the access sheath and advanced proximally until the stone was identified.  The stone was then fragmented with a 273 micron holmium laser fiber on a setting of 0.3 J and frequency of 30 hz.   After the stone was adjusted down to a less than 5 mm fragment this was grasped with a 0 tip nitinol basket and removed along with a few smaller 2 mm fragments.  The ureteroscope was repassed and no additional fragments were seen in the ureter.  Retrograde pyelogram was performed with findings as described above.  All calyces were examined and no additional stone fragments were identified.  A 6 FR/24 cm stent was was placed under fluoroscopic guidance.  The wire was then removed with an adequate stent curl noted in the renal pelvis as well as in the bladder.  The bladder was then emptied and the procedure ended.  The patient appeared to tolerate the procedure well and without complications.  After anesthetic reversal the patient was transported to the PACU in stable condition.   The stone appeared impacted in the proximal ureter and her stent will be left indwelling for 2 weeks.     Scott C. Lonna CobbStoioff, MD

## 2017-07-17 NOTE — Transfer of Care (Signed)
Immediate Anesthesia Transfer of Care Note  Patient: Miranda Luna  Procedure(s) Performed: CYSTOSCOPY/URETEROSCOPY/HOLMIUM LASER/STENT PLACEMENT (Right )  Patient Location: PACU  Anesthesia Type:General  Level of Consciousness: awake  Airway & Oxygen Therapy: Patient connected to face mask oxygen  Post-op Assessment: Report given to RN and Post -op Vital signs reviewed and stable  Post vital signs: stable  Last Vitals:  Vitals Value Taken Time  BP 144/84 07/17/2017  9:00 AM  Temp 36.3 C 07/17/2017  8:58 AM  Pulse 89 07/17/2017  8:59 AM  Resp 13 07/17/2017  9:01 AM  SpO2 100 % 07/17/2017  8:59 AM  Vitals shown include unvalidated device data.  Last Pain:  Vitals:   07/17/17 0858  TempSrc:   PainSc: 0-No pain         Complications: No apparent anesthesia complications

## 2017-07-17 NOTE — Telephone Encounter (Signed)
Called and LM for pt to CB to confirm app  Miranda Luna

## 2017-07-24 LAB — STONE ANALYSIS
CA OXALATE, MONOHYDR.: 90 %
CA PHOS CRY STONE QL IR: 3 %
Ca Oxalate,Dihydrate: 7 %
STONE WEIGHT KSTONE: 30.5 mg

## 2017-07-28 ENCOUNTER — Ambulatory Visit (INDEPENDENT_AMBULATORY_CARE_PROVIDER_SITE_OTHER): Payer: Managed Care, Other (non HMO) | Admitting: Urology

## 2017-07-28 ENCOUNTER — Encounter: Payer: Self-pay | Admitting: Urology

## 2017-07-28 VITALS — BP 135/80 | HR 87 | Ht 69.0 in | Wt 278.0 lb

## 2017-07-28 DIAGNOSIS — N132 Hydronephrosis with renal and ureteral calculous obstruction: Secondary | ICD-10-CM

## 2017-07-28 DIAGNOSIS — N201 Calculus of ureter: Secondary | ICD-10-CM | POA: Diagnosis not present

## 2017-07-28 LAB — URINALYSIS, COMPLETE

## 2017-07-28 LAB — MICROSCOPIC EXAMINATION

## 2017-07-28 MED ORDER — LIDOCAINE HCL URETHRAL/MUCOSAL 2 % EX GEL: 1.0000 "application " | Freq: Once | CUTANEOUS | Status: DC

## 2017-07-28 MED ORDER — CIPROFLOXACIN HCL 500 MG PO TABS
500.0000 mg | ORAL_TABLET | Freq: Once | ORAL | Status: AC
  Administered 2017-07-28: 500 mg via ORAL

## 2017-07-28 NOTE — Progress Notes (Signed)
Indications: Patient is 37 y.o., female who recently underwent ureteroscopic removal of 5 mm right proximal ureteral/UPJ calculus The patient is presenting today for stent removal.  Other than mild to moderate stent irritation she had no postoperative problems.  Procedure:  Flexible Cystoscopy with stent removal ([id number])  Timeout was performed and the correct patient, procedure and participants were identified.    Description:  The patient was prepped and draped in the usual sterile fashion. Flexible cystosopy was performed.  The stent was visualized, grasped, and removed intact without difficulty. The patient tolerated the procedure well.  A single dose of oral antibiotics was given.  Complications:  None  Plan: She was instructed to call for flank pain post stent removal.  I recommended proceeding with a metabolic evaluation.  Follow-up with a KUB in 3 months.

## 2017-07-30 ENCOUNTER — Encounter: Payer: Self-pay | Admitting: Urology

## 2017-07-31 ENCOUNTER — Encounter: Payer: Self-pay | Admitting: Urology

## 2017-08-11 ENCOUNTER — Ambulatory Visit: Payer: Managed Care, Other (non HMO) | Admitting: Urology

## 2017-09-06 ENCOUNTER — Encounter: Payer: Self-pay | Admitting: Urology

## 2017-12-03 ENCOUNTER — Ambulatory Visit: Payer: Managed Care, Other (non HMO) | Admitting: Urology

## 2017-12-07 ENCOUNTER — Encounter: Payer: Self-pay | Admitting: Urology

## 2018-03-16 ENCOUNTER — Ambulatory Visit
Admission: RE | Admit: 2018-03-16 | Discharge: 2018-03-16 | Disposition: A | Discharge status: Home / Self Care | Payer: Managed Care, Other (non HMO) | Attending: Urology | Admitting: Urology

## 2018-03-16 ENCOUNTER — Ambulatory Visit
Admission: RE | Admit: 2018-03-16 | Discharge: 2018-03-16 | Disposition: A | Discharge status: Home / Self Care | Payer: Managed Care, Other (non HMO) | Source: Ambulatory Visit | Attending: Urology | Admitting: Urology

## 2018-03-16 ENCOUNTER — Encounter: Payer: Self-pay | Admitting: Urology

## 2018-03-16 ENCOUNTER — Ambulatory Visit (INDEPENDENT_AMBULATORY_CARE_PROVIDER_SITE_OTHER): Payer: Managed Care, Other (non HMO) | Admitting: Urology

## 2018-03-16 VITALS — BP 169/96 | HR 85 | Ht 69.0 in | Wt 267.9 lb

## 2018-03-16 DIAGNOSIS — N390 Urinary tract infection, site not specified: Secondary | ICD-10-CM | POA: Diagnosis not present

## 2018-03-16 DIAGNOSIS — N201 Calculus of ureter: Secondary | ICD-10-CM | POA: Diagnosis not present

## 2018-03-16 DIAGNOSIS — N132 Hydronephrosis with renal and ureteral calculous obstruction: Secondary | ICD-10-CM | POA: Diagnosis not present

## 2018-03-16 DIAGNOSIS — Z87448 Personal history of other diseases of urinary system: Secondary | ICD-10-CM

## 2018-03-16 LAB — URINALYSIS, COMPLETE
Bilirubin, UA: NEGATIVE
Ketones, UA: NEGATIVE
Leukocytes, UA: NEGATIVE
NITRITE UA: NEGATIVE
Protein, UA: NEGATIVE
Specific Gravity, UA: 1.02 (ref 1.005–1.030)
Urobilinogen, Ur: 0.2 mg/dL (ref 0.2–1.0)
pH, UA: 5.5 (ref 5.0–7.5)

## 2018-03-16 LAB — MICROSCOPIC EXAMINATION
RBC MICROSCOPIC, UA: NONE SEEN /HPF (ref 0–2)
WBC, UA: NONE SEEN /hpf (ref 0–5)

## 2018-03-16 MED ORDER — AMOXICILLIN-POT CLAVULANATE 875-125 MG PO TABS: 1.0000 | ORAL_TABLET | Freq: Two times a day (BID) | ORAL | 0 refills | Status: DC

## 2018-03-16 NOTE — Progress Notes (Signed)
03/16/2018  2:54 PM   Miranda Luna Jun 28, 1980 [id number]  Referring provider: No referring provider defined for this encounter.  Chief Complaint  Patient presents with  . Flank Pain    HPI: Patient is a 38 year old African American female with a history of nephrolithiasis returns today for the evaluation and management of flank pain and dysuria.   Today, she reports of dysuria and right flank pain. She started having sharp right pain on 03/12/2018. She did not have pain the following day and had this unbearable pain the next day, on 03/13/2018. She passed two stones 1 day ago. She also had vomited while she had the pain.   She reports of quitting soda, red meat, and sugary sweets. She reports her HbA1c is usually between 90-110 and 140-160 after meals.   She reports of a bladder prolapse after taking Metformin. She does not have urinary incontinence when laughing, coughing, and sneezing.   Her UA is negative for RBC and positive for moderate bacteria.   Previous History:  She presented to the ED on 05/18/2017 with the complaint of severe right flank pain which she described as throbbing radiating to her right groin associated with nausea.  CT Renal stone study on 05/18/2017 demonstrated that her kidneys are orthotopic, the RIGHT is mildly larger, likely edematous. Mild RIGHT hydronephrosis. 5 mm RIGHT ureteral pelvic junction calculus. 2 mm RIGHT lower pole nephrolithiasis. No LEFT hydronephrosis or nephrolithiasis. RIGHT perinephric fat stranding. Limited assessment for renal masses on this nonenhanced examination. The unopacified ureters are normal in course and caliber. Urinary bladder is partially distended and unremarkable. Normal adrenal glands.  Her creatinine was 0.98.  She was given Zofran, Toradol and Morphine in the ED.  She was discharged with hydrocodone/APAP 5/325, ibuprofen 600 mg, ondansetron '4mg'$  ODT and tamsulosin 0.4 mg daily.   Pt had a ureteroscopy on 07/17/2017. She  had a cystoscopy with stent removal on 07/28/2017.   PMH: Past Medical History:  Diagnosis Date  . Diabetes mellitus without complication ([place])   . Genital HSV   . History of kidney stones   . Hypertension    elevated with pregnancy (eclampsia) only  . Migraines    once every 2 weeks or so. excedrin migraine helps  . Osgood-Schlatter's disease of knees, bilateral    HAS HAD SINCE AGE OF 17    Surgical History: Past Surgical History:  Procedure Laterality Date  . ADENOIDECTOMY    . CYSTOSCOPY/URETEROSCOPY/HOLMIUM LASER/STENT PLACEMENT Right 07/17/2017   Procedure: CYSTOSCOPY/URETEROSCOPY/HOLMIUM LASER/STENT PLACEMENT;  Surgeon: Abbie Sons, MD;  Location: [facility] ORS;  Service: Urology;  Laterality: Right;  . TONSILLECTOMY    . TONSILLECTOMY  1990  . WISDOM TOOTH EXTRACTION      Home Medications:  Allergies as of 03/16/2018      Reactions   Imitrex [sumatriptan] Nausea And Vomiting   Pork-derived Products Diarrhea   Migraines, cramping    Acyclovir And Related    Hydrocodone Itching   Tolerates with benadryl       Medication List       Accurate as of March 16, 2018  2:54 PM. Always use your most recent med list.        amoxicillin-clavulanate 875-125 MG tablet Commonly known as:  AUGMENTIN Take 1 tablet by mouth every 12 (twelve) hours.   aspirin-acetaminophen-caffeine 250-250-65 MG tablet Commonly known as:  EXCEDRIN MIGRAINE Take 2 tablets by mouth daily as needed for headache.   diphenhydrAMINE 25 MG tablet Commonly known  as:  BENADRYL Take 25 mg by mouth every 6 (six) hours as needed for itching.   LO LOESTRIN FE 1 MG-10 MCG / 10 MCG tablet Generic drug:  Norethindrone-Ethinyl Estradiol-Fe Biphas Take 1 tablet by mouth daily.   metFORMIN 500 MG tablet Commonly known as:  GLUCOPHAGE Take 1,000 mg by mouth 2 (two) times daily.   ONE TOUCH ULTRA 2 w/Device Kit   ONE TOUCH ULTRA TEST test strip Generic drug:  glucose blood   ONETOUCH DELICA  LANCETS FINE Misc   oxybutynin 5 MG tablet Commonly known as:  DITROPAN 1 tab tid prn frequency,urgency, bladder spasm   STEGLATRO PO Take by mouth.   VISINE OP Place 1 drop into both eyes daily as needed (dry eyes).       Allergies:  Allergies  Allergen Reactions  . Imitrex [Sumatriptan] Nausea And Vomiting  . Pork-Derived Products Diarrhea    Migraines, cramping   . Acyclovir And Related   . Hydrocodone Itching    Tolerates with benadryl     Family History: Family History  Problem Relation Age of Onset  . Hypertension Mother   . Heart disease Mother   . Diabetes Maternal Grandmother   . Hypertension Maternal Grandmother     Social History:  reports that she has never smoked. She has never used smokeless tobacco. She reports current alcohol use. She reports that she does not use drugs.  ROS: UROLOGY Frequent Urination?: No Hard to postpone urination?: No Burning/pain with urination?: Yes Get up at night to urinate?: No Leakage of urine?: No Urine stream starts and stops?: No Trouble starting stream?: No Do you have to strain to urinate?: No Blood in urine?: No Urinary tract infection?: No Sexually transmitted disease?: No Injury to kidneys or bladder?: No Painful intercourse?: No Weak stream?: No Currently pregnant?: No Vaginal bleeding?: No Last menstrual period?: n  Gastrointestinal Nausea?: No Vomiting?: No Indigestion/heartburn?: No Diarrhea?: No Constipation?: No  Constitutional Fever: No Night sweats?: No Weight loss?: No Fatigue?: No  Skin Skin rash/lesions?: No Itching?: No  Eyes Blurred vision?: No Double vision?: No  Ears/Nose/Throat Sore throat?: No Sinus problems?: No  Hematologic/Lymphatic Swollen glands?: No Easy bruising?: No  Cardiovascular Leg swelling?: No Chest pain?: No  Respiratory Cough?: No Shortness of breath?: No  Endocrine Excessive thirst?: No  Musculoskeletal Back pain?: No Joint pain?:  No  Neurological Headaches?: No Dizziness?: No  Psychologic Depression?: No Anxiety?: No  Physical Exam: BP (!) 169/96 (BP Location: Left Arm, Patient Position: Sitting, Cuff Size: Normal)   Pulse 85   Ht '5\' 9"'$  (1.753 m)   Wt 267 lb 14.4 oz (121.5 kg)   BMI 39.56 kg/m   Constitutional:  Well nourished. Alert and oriented, No acute distress. HEENT: [place] AT, moist mucus membranes.  Trachea midline, no masses. Cardiovascular: No clubbing, cyanosis, or edema. Respiratory: Normal respiratory effort, no increased work of breathing. Skin: No rashes, bruises or suspicious lesions. Neurologic: Grossly intact, no focal deficits, moving all 4 extremities. Psychiatric: Normal mood and affect.   Laboratory Data:  Urinalysis Negative. See Epic.   I have reviewed the labs.  Assessment & Plan:    1. Right ureteral stone - She had a ureteroscopy with cystoscopy, holmium laser stent placement on 07/17/2017  - She passed 2 stones 1 day ago  - KUB today showed no stones  -Schedule RUS and if clear perform a metabolic workup to identify reason for stone formation  - Advised to contact our office or seek  treatment in the ED if becomes febrile or pain/ vomiting are difficult control in order to arrange for emergent/urgent intervention  2. Right hydronephrosis - Schedule RUS; see above   3. History of hematuria - UA today demonstrates no hematuria, positive for bacteria - Continue to monitor the patient's UA after the treatment/passage of the stone to ensure the hematuria has resolved - if hematuria persists, we will pursue a hematuria workup with CT Urogram and cystoscopy if appropriate.  4. UTI - UA today positive for moderate bacteria - UA culture sent - pending  - Rx of Augmentin 875 - 125 mg tablet; take 1 tablet by mouth every 12 hours    Return for pending urine culture .  These notes generated with voice recognition software. I apologize for typographical errors.  Zara Council, PA-C  [place] [address] [place], [place] [address] [phone]  I, Lucas Mallow, am acting as a Education administrator for [employer],  I have reviewed the above documentation for accuracy and completeness, and I agree with the above.    Zara Council, PA-C

## 2018-03-18 ENCOUNTER — Telehealth: Payer: Self-pay | Admitting: Urology

## 2018-03-18 NOTE — Telephone Encounter (Signed)
Please order a [facility] for Miranda Luna.

## 2018-03-18 NOTE — Telephone Encounter (Signed)
Faxed patient information to litholink per Michiel CowboyShannon McGowan, PA. Sent instructions to patient through mychart as well as mailed a copy to patients home address.

## 2018-03-19 LAB — CULTURE, URINE COMPREHENSIVE

## 2018-03-30 NOTE — Telephone Encounter (Signed)
Her RUS was scheduled for the 12th but the patient canceled stated that she did not want to have it done   [facility]

## 2018-03-31 ENCOUNTER — Ambulatory Visit: Payer: Managed Care, Other (non HMO)

## 2018-04-05 ENCOUNTER — Other Ambulatory Visit: Payer: Managed Care, Other (non HMO)

## 2018-04-12 ENCOUNTER — Telehealth: Payer: Self-pay | Admitting: Urology

## 2018-04-12 ENCOUNTER — Other Ambulatory Visit: Payer: Self-pay | Admitting: Urology

## 2018-04-12 NOTE — Telephone Encounter (Signed)
Please let Miranda Luna know that her 24 hour urine results are available and she has an appointment schedule with Dr. Lonna Cobb to review them.

## 2018-04-12 NOTE — Telephone Encounter (Signed)
She is already scheduled for the 13th   Miranda Luna

## 2018-04-22 NOTE — Telephone Encounter (Signed)
I called pt requesting to move her litholink result appt on 3/13 at 2:15 w/Stoioff.  Pt said she has already requested the day off and will not be able to change appt.  She wants to know if she can be given results over the phone.

## 2018-04-22 NOTE — Telephone Encounter (Signed)
Mrs. Ohman doesn't need to move her appointment up.  She can keep the one with Dr.Stoioff.

## 2018-04-28 NOTE — Telephone Encounter (Signed)
I tried to reschedule pt for litholink results due to [facility] being off the afternoon of 3/13.  Pt said she had already asked for that day off work and couldn't reschedule and wanted to know if results could be given to her by phone.  As of right now, she is still on Friday schedule at 2:15 and there won't be anyone here that afternoon.  Could someone call her with results?  Please advise.

## 2018-04-28 NOTE — Telephone Encounter (Signed)
Discussed Litholink results with Miranda Luna.  She has low urine volume, hypercalciuria and mild hyperoxaluria.  She also had a low urine pH but did not have uric acid stones.  Her blood work was normal including serum calcium  I discussed thiazide therapy for her hypercalciuria however she wanted to hold off on medical management.  She did have an elevated urine sodium and I recommended increasing her water intake keep urine output approximately 2.5 L/day; lowering her dietary sodium; consuming at least 2-3 servings of calcium daily.  Clinical: Please schedule follow-up 24-hour urine study in 3-4 months.  She does not need blood work  Admin: Can cancel her 3/13 p.m. appointment if not already done

## 2018-04-28 NOTE — Telephone Encounter (Signed)
Since I am not going to be here Friday afternoon I contacted the patient regarding her Litholink results and left a voicemail.

## 2018-04-30 ENCOUNTER — Ambulatory Visit: Payer: Managed Care, Other (non HMO) | Admitting: Urology

## 2018-05-04 ENCOUNTER — Ambulatory Visit: Payer: Managed Care, Other (non HMO) | Admitting: Urology

## 2018-09-03 ENCOUNTER — Ambulatory Visit: Payer: Self-pay | Admitting: Urology

## 2018-12-02 ENCOUNTER — Encounter: Payer: Self-pay | Admitting: Urology

## 2018-12-02 ENCOUNTER — Other Ambulatory Visit: Payer: Self-pay

## 2018-12-02 ENCOUNTER — Other Ambulatory Visit: Payer: Self-pay | Admitting: Urology

## 2018-12-02 ENCOUNTER — Ambulatory Visit
Admission: RE | Admit: 2018-12-02 | Discharge: 2018-12-02 | Disposition: A | Discharge status: Home / Self Care | Payer: Managed Care, Other (non HMO) | Source: Ambulatory Visit | Attending: Urology | Admitting: Urology

## 2018-12-02 ENCOUNTER — Ambulatory Visit (INDEPENDENT_AMBULATORY_CARE_PROVIDER_SITE_OTHER): Payer: Managed Care, Other (non HMO) | Admitting: Urology

## 2018-12-02 VITALS — BP 147/98 | HR 87 | Ht 69.0 in | Wt 274.0 lb

## 2018-12-02 DIAGNOSIS — R109 Unspecified abdominal pain: Secondary | ICD-10-CM | POA: Diagnosis not present

## 2018-12-02 DIAGNOSIS — N201 Calculus of ureter: Secondary | ICD-10-CM | POA: Diagnosis present

## 2018-12-02 DIAGNOSIS — N390 Urinary tract infection, site not specified: Secondary | ICD-10-CM

## 2018-12-02 DIAGNOSIS — R3129 Other microscopic hematuria: Secondary | ICD-10-CM | POA: Diagnosis not present

## 2018-12-02 DIAGNOSIS — Z87442 Personal history of urinary calculi: Secondary | ICD-10-CM

## 2018-12-02 DIAGNOSIS — Z87448 Personal history of other diseases of urinary system: Secondary | ICD-10-CM

## 2018-12-02 DIAGNOSIS — B3731 Acute candidiasis of vulva and vagina: Secondary | ICD-10-CM | POA: Insufficient documentation

## 2018-12-02 DIAGNOSIS — N61 Mastitis without abscess: Secondary | ICD-10-CM | POA: Insufficient documentation

## 2018-12-02 DIAGNOSIS — N926 Irregular menstruation, unspecified: Secondary | ICD-10-CM | POA: Insufficient documentation

## 2018-12-02 DIAGNOSIS — N644 Mastodynia: Secondary | ICD-10-CM | POA: Insufficient documentation

## 2018-12-02 DIAGNOSIS — N200 Calculus of kidney: Secondary | ICD-10-CM

## 2018-12-02 DIAGNOSIS — B373 Candidiasis of vulva and vagina: Secondary | ICD-10-CM | POA: Insufficient documentation

## 2018-12-02 LAB — URINALYSIS, COMPLETE
Bilirubin, UA: NEGATIVE
Glucose, UA: NEGATIVE
Ketones, UA: NEGATIVE
Leukocytes,UA: NEGATIVE
Nitrite, UA: NEGATIVE
Protein,UA: NEGATIVE
Specific Gravity, UA: 1.025 (ref 1.005–1.030)
Urobilinogen, Ur: 0.2 mg/dL (ref 0.2–1.0)
pH, UA: 6 (ref 5.0–7.5)

## 2018-12-02 LAB — MICROSCOPIC EXAMINATION

## 2018-12-02 NOTE — Progress Notes (Signed)
12/02/2018  3:06 PM   Miranda Luna 01-Mar-1980 [id number]  Referring provider: [name] [address] [place],  [place] [address]  Chief Complaint  Patient presents with   Nephrolithiasis    HPI: Patient is a 38 year old female with a history of nephrolithiasis returns today for the evaluation and management of flank pain.  CT Renal stone study on 05/18/2017 demonstrated that her kidneys are orthotopic, the RIGHT is mildly larger, likely edematous. Mild RIGHT hydronephrosis. 5 mm RIGHT ureteral pelvic junction calculus. 2 mm RIGHT lower pole nephrolithiasis. No LEFT hydronephrosis or nephrolithiasis. RIGHT perinephric fat stranding. Limited assessment for renal masses on this nonenhanced examination. The unopacified ureters are normal in course and caliber. Urinary bladder is partially distended and unremarkable. Normal adrenal glands.     Pt had a ureteroscopy on 07/17/2017. She had a cystoscopy with stent removal on 07/28/2017.  She did not have a follow up renal ultrasound for unknown reasons.   KUB 03/16/2018 revealed calcification again noted to project over the right L2 transverse process which could reflect a proximal right ureteral stone. This is stable since prior study. Given the stability over several months, this could reflect a soft tissue calcification outside of the ureter. If felt clinically indicated, this could be further evaluated with CT urogram.  She presents today after experiencing right flank pain that began last evening causing nausea and vomiting.  She states that over night the vomiting would occur after she voided.  Her pain was 9/10 intensity last night and 7/10 intensity at this time.  She states the pain is lasting about 2 hours at a time.  She is having associated urinary frequency.  Patient denies any gross hematuria, dysuria or suprapubic/flank pain.  Patient denies any fevers, chills, nausea or vomiting.  Her UA  today is positive for 3-10 RBC's.    KUB 12/02/2018 previously seen density along L2 is not well appreciated on today's exam. No definitive ureteral stones are seen. If clinical suspicion persists, CT urography is recommended for further evaluation.   PMH: Past Medical History:  Diagnosis Date   Diabetes mellitus without complication ([place])    Genital HSV    History of kidney stones    Hypertension    elevated with pregnancy (eclampsia) only   Migraines    once every 2 weeks or so. excedrin migraine helps   Osgood-Schlatter's disease of knees, bilateral    HAS HAD SINCE AGE OF 17    Surgical History: Past Surgical History:  Procedure Laterality Date   ADENOIDECTOMY     CYSTOSCOPY/URETEROSCOPY/HOLMIUM LASER/STENT PLACEMENT Right 07/17/2017   Procedure: CYSTOSCOPY/URETEROSCOPY/HOLMIUM LASER/STENT PLACEMENT;  Surgeon: Abbie Sons, MD;  Location: [facility] ORS;  Service: Urology;  Laterality: Right;   TONSILLECTOMY     TONSILLECTOMY  1990   WISDOM TOOTH EXTRACTION      Home Medications:  Allergies as of 12/02/2018      Reactions   Imitrex [sumatriptan] Nausea And Vomiting   Pork-derived Products Diarrhea   Migraines, cramping    Acyclovir And Related    Hydrocodone Itching   Tolerates with benadryl       Medication List       Accurate as of December 02, 2018  3:06 PM. If you have any questions, ask your nurse or doctor.        amoxicillin-clavulanate 875-125 MG tablet Commonly known as: AUGMENTIN Take 1 tablet by mouth every 12 (twelve) hours.   aspirin-acetaminophen-caffeine [phone]  MG tablet Commonly known as: EXCEDRIN MIGRAINE Take 2 tablets by mouth daily as needed for headache.   Blisovi 24 Fe 1-20 MG-MCG(24) tablet Generic drug: Norethindrone Acetate-Ethinyl Estrad-FE   diphenhydrAMINE 25 MG tablet Commonly known as: BENADRYL Take 25 mg by mouth every 6 (six) hours as needed for itching.   Lo Loestrin Fe 1 MG-10 MCG / 10 MCG  tablet Generic drug: Norethindrone-Ethinyl Estradiol-Fe Biphas Take 1 tablet by mouth daily.   metFORMIN 500 MG tablet Commonly known as: GLUCOPHAGE Take 1,000 mg by mouth 2 (two) times daily.   ONE TOUCH ULTRA 2 w/Device Kit   ONE TOUCH ULTRA TEST test strip Generic drug: glucose blood   OneTouch Delica Lancets Fine Misc   oxybutynin 5 MG tablet Commonly known as: DITROPAN 1 tab tid prn frequency,urgency, bladder spasm   STEGLATRO PO Take by mouth.   VISINE OP Place 1 drop into both eyes daily as needed (dry eyes).       Allergies:  Allergies  Allergen Reactions   Imitrex [Sumatriptan] Nausea And Vomiting   Pork-Derived Products Diarrhea    Migraines, cramping    Acyclovir And Related    Hydrocodone Itching    Tolerates with benadryl     Family History: Family History  Problem Relation Age of Onset   Hypertension Mother    Heart disease Mother    Diabetes Maternal Grandmother    Hypertension Maternal Grandmother     Social History:  reports that she has never smoked. She has never used smokeless tobacco. She reports current alcohol use. She reports that she does not use drugs.  ROS: UROLOGY Frequent Urination?: Yes Hard to postpone urination?: No Burning/pain with urination?: No Get up at night to urinate?: No Leakage of urine?: No Urine stream starts and stops?: No Trouble starting stream?: No Do you have to strain to urinate?: No Blood in urine?: No Urinary tract infection?: No Sexually transmitted disease?: No Injury to kidneys or bladder?: No Painful intercourse?: No Weak stream?: No Currently pregnant?: No Vaginal bleeding?: No Last menstrual period?: n  Gastrointestinal Nausea?: Yes Vomiting?: Yes Indigestion/heartburn?: No Diarrhea?: No Constipation?: No  Constitutional Fever: No Night sweats?: No Weight loss?: No Fatigue?: No  Skin Skin rash/lesions?: No Itching?: No  Eyes Blurred vision?: No Double vision?:  No  Ears/Nose/Throat Sore throat?: No Sinus problems?: No  Hematologic/Lymphatic Swollen glands?: No Easy bruising?: No  Cardiovascular Leg swelling?: No Chest pain?: No  Respiratory Cough?: No Shortness of breath?: No  Endocrine Excessive thirst?: No  Musculoskeletal Back pain?: Yes Joint pain?: No  Neurological Headaches?: No Dizziness?: No  Psychologic Depression?: No Anxiety?: No  Physical Exam: BP (!) 147/98    Pulse 87    Ht 5' 9"  (1.753 m)    Wt 274 lb (124.3 kg)    LMP 11/18/2018 Comment: on birth control   BMI 40.46 kg/m   Constitutional:  Well nourished. Alert and oriented, No acute distress. HEENT: [place] AT, moist mucus membranes.  Trachea midline, no masses. Cardiovascular: No clubbing, cyanosis, or edema. Respiratory: Normal respiratory effort, no increased work of breathing. Skin: No rashes, bruises or suspicious lesions. Neurologic: Grossly intact, no focal deficits, moving all 4 extremities. Psychiatric: Normal mood and affect.   Laboratory Data:  Urinalysis Component     Latest Ref Rng & Units 12/02/2018  Specific Gravity, UA     1.005 - 1.030 1.025  pH, UA     5.0 - 7.5 6.0  Color, UA     Yellow  Yellow  Appearance Ur     Clear Clear  Leukocytes,UA     Negative Negative  Protein,UA     Negative/Trace Negative  Glucose, UA     Negative Negative  Ketones, UA     Negative Negative  RBC, UA     Negative Trace (A)  Bilirubin, UA     Negative Negative  Urobilinogen, Ur     0.2 - 1.0 mg/dL 0.2  Nitrite, UA     Negative Negative  Microscopic Examination      See below:   Component     Latest Ref Rng & Units 12/02/2018  WBC, UA     0 - 5 /hpf 0-5  RBC     0 - 2 /hpf 3-10 (A)  Epithelial Cells (non renal)     0 - 10 /hpf 0-10  Bacteria, UA     None seen/Few Few  I have reviewed the labs.  Pertinent Imaging CLINICAL DATA:  History of prior right rib ureteral stone with right flank pain and hematuria  EXAM: ABDOMEN - 1  VIEW  COMPARISON:  03/16/2018  FINDINGS: Scattered large and small bowel gas is noted. Previously seen calcification to the right of the midline at L2 is not as well appreciated on today's exam. No other calculi are seen. No bony abnormality is noted. Multiple phleboliths are seen within the pelvis.  IMPRESSION: Previously seen density along L2 is not well appreciated on today's exam. No definitive ureteral stones are seen. If clinical suspicion persists, CT urography is recommended for further evaluation.   Electronically Signed   By: Inez Catalina M.D.   On: 12/02/2018 11:57 I have independently reviewed the films and do not appreciate a stone.    Assessment & Plan:    1. History of nephrolithiasis/flank pain No stone seen on patient's KUB today UA with 3-10 RBC's, so she is likely experiencing an ureteral stone Offered STAT CT Renal stone study, but patient could not have one today due to child care issues Order a CT Renal stone study ASAP Patient is advised that if they should start to experience pain that is not able to be controlled with pain medication, intractable nausea and/or vomiting and/or fevers greater than 103 or shaking chills to contact the office immediately or seek treatment in the emergency department for emergent intervention.    2. History of hematuria - UA today with 3-10 RBC's - likely due to migrating stone - CT Renal stone pending   Return for schedule CT Renal stone study - Sharyn Lull will call her .  These notes generated with voice recognition software. I apologize for typographical errors.  Zara Council, PA-C  [facility] [address] [place], [place] [address] [phone]  I, Lucas Mallow, am acting as a Education administrator for [employer],  I have reviewed the above documentation for accuracy and completeness, and I agree with the above.    Zara Council, PA-C

## 2018-12-02 NOTE — Progress Notes (Signed)
This encounter was created in error - please disregard.

## 2018-12-05 LAB — CULTURE, URINE COMPREHENSIVE

## 2018-12-07 ENCOUNTER — Other Ambulatory Visit: Payer: Self-pay

## 2018-12-07 ENCOUNTER — Other Ambulatory Visit: Payer: Self-pay | Admitting: Urology

## 2018-12-07 ENCOUNTER — Ambulatory Visit
Admission: RE | Admit: 2018-12-07 | Discharge: 2018-12-07 | Disposition: A | Discharge status: Home / Self Care | Payer: Managed Care, Other (non HMO) | Source: Ambulatory Visit | Attending: Urology | Admitting: Urology

## 2018-12-07 DIAGNOSIS — R3129 Other microscopic hematuria: Secondary | ICD-10-CM | POA: Insufficient documentation

## 2018-12-07 DIAGNOSIS — R109 Unspecified abdominal pain: Secondary | ICD-10-CM | POA: Insufficient documentation

## 2018-12-07 DIAGNOSIS — N838 Other noninflammatory disorders of ovary, fallopian tube and broad ligament: Secondary | ICD-10-CM

## 2018-12-07 NOTE — Progress Notes (Unsigned)
I have sent a referral in for [facility] for a right ovarian mass.  She goes to [facility] in [place].  # [phone].

## 2018-12-08 ENCOUNTER — Telehealth: Payer: Self-pay | Admitting: Urology

## 2018-12-08 NOTE — Telephone Encounter (Signed)
I placed the referral to [facility] yesterday (12/07/2018) at 1:30 pm EDT.

## 2018-12-08 NOTE — Telephone Encounter (Signed)
[facility] faxed referral.

## 2018-12-08 NOTE — Telephone Encounter (Signed)
Pt called and states that she needs a referral sent to [facility] by tomorrow morning at 8:30 am.

## 2018-12-21 ENCOUNTER — Telehealth: Payer: Self-pay | Admitting: Urology

## 2018-12-21 NOTE — Telephone Encounter (Signed)
I called pt to inquire about notes from [facility] and she states that she hasn't had anything done yet, so there are no notes.

## 2019-02-03 ENCOUNTER — Telehealth: Payer: Self-pay | Admitting: Urology

## 2019-02-03 NOTE — Telephone Encounter (Signed)
Would you check and see if Miranda Luna had her pelvic ultrasound at her gynecologists office and if so may we have the notes?

## 2019-02-03 NOTE — Telephone Encounter (Signed)
LMOM for patient to return call.

## 2019-02-09 ENCOUNTER — Encounter: Payer: Self-pay | Admitting: Family Medicine

## 2019-02-09 NOTE — Telephone Encounter (Signed)
LMOM for patient to return call. Then sent a Mychart message asking if she had the pelvic ultrasound.

## 2019-11-28 NOTE — Progress Notes (Signed)
11/29/2019 10:35 AM   Octavio Manns 11/12/1980 [id number]  Referring provider: [name] [address],  [place] [address] Chief Complaint  Patient presents with  . Nephrolithiasis    HPI: Miranda Luna is a 39 y.o. female who returns for 1 year follow up with a history of nephrolithiasis/flank pain and hematuria.   CT Renal stone study on 05/18/2017 demonstrated that her kidneys are orthotopic, the RIGHT is mildly larger, likely edematous. Mild RIGHT hydronephrosis. 5 mm RIGHT ureteral pelvic junction calculus. 2 mm RIGHT lower pole nephrolithiasis. No LEFT hydronephrosis or nephrolithiasis. RIGHT perinephric fat stranding. Limited assessment for renal masses on this nonenhanced examination. The unopacified ureters are normal in course and caliber. Urinary bladder is partially distended and unremarkable. Normal adrenal glands.      Pt had a ureteroscopy on 07/17/2017. She had a cystoscopy with stent removal on 07/28/2017. She did not have a follow up renal ultrasound for unknown reasons.    KUB 03/16/2018 revealed calcification again noted to project over the right L2 transverse process which could reflect a proximal right ureteral stone. This is stable since prior study. Given the stability over several months, this could reflect a soft tissue calcification outside of the ureter. If felt clinically indicated, this could be further evaluated with CT urogram.  KUB 12/02/2018 previously seen density along L2 was not well appreciated on exam. No definitive ureteral stones are seen. If clinical suspicion persists, CT urography is recommended for further evaluation.  CT renal stone study on 12/07/2018 noted tiny nonobstructing right renal calculi. No evidence of ureteral calculi, hydronephrosis, or other acute findings. Colonic diverticulosis, without radiographic evidence of diverticulitis. 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  She states some  alternating flank pain and discomfort. She has had associating nausea and vomiting. She feels much better after her stone episode.   She has cut back on salt intake. She is drinking a lot of water. She has changed her diet.   She states that she keeps passing stones and is worried. She brought in a bag full of stones today. She feels like Rybelsus, diabetes and constipation could be contributing to her kidney stones productions. She notes some weight loss.   Her diabetes is under good control.   KUB today shows no stones. There was pelvic phleboliths.  UA today 3-10 RBC's, but patient is experiencing menses.    PMH: Past Medical History:  Diagnosis Date  . Diabetes mellitus without complication ([place])   . Genital HSV   . History of kidney stones   . Hypertension    elevated with pregnancy (eclampsia) only  . Migraines    once every 2 weeks or so. excedrin migraine helps  . Osgood-Schlatter's disease of knees, bilateral    HAS HAD SINCE AGE OF 17    Surgical History: Past Surgical History:  Procedure Laterality Date  . ADENOIDECTOMY    . CYSTOSCOPY/URETEROSCOPY/HOLMIUM LASER/STENT PLACEMENT Right 07/17/2017   Procedure: CYSTOSCOPY/URETEROSCOPY/HOLMIUM LASER/STENT PLACEMENT;  Surgeon: Abbie Sons, MD;  Location: [facility] ORS;  Service: Urology;  Laterality: Right;  . TONSILLECTOMY    . TONSILLECTOMY  1990  . WISDOM TOOTH EXTRACTION      Home Medications:  Allergies as of 11/29/2019      Reactions   Imitrex [sumatriptan] Nausea And Vomiting   Pork-derived Products Diarrhea   Migraines, cramping    Acyclovir And Related    Hydrocodone Itching   Tolerates with benadryl  Medication List       Accurate as of November 29, 2019 10:35 AM. If you have any questions, ask your nurse or doctor.        STOP taking these medications   amoxicillin-clavulanate 875-125 MG tablet Commonly known as: AUGMENTIN Stopped by: Zara Council, PA-C     TAKE these medications     aspirin-acetaminophen-caffeine 250-250-65 MG tablet Commonly known as: EXCEDRIN MIGRAINE Take 2 tablets by mouth daily as needed for headache.   Blisovi 24 Fe 1-20 MG-MCG(24) tablet Generic drug: Norethindrone Acetate-Ethinyl Estrad-FE   diphenhydrAMINE 25 MG tablet Commonly known as: BENADRYL Take 25 mg by mouth every 6 (six) hours as needed for itching.   Lo Loestrin Fe 1 MG-10 MCG / 10 MCG tablet Generic drug: Norethindrone-Ethinyl Estradiol-Fe Biphas Take 1 tablet by mouth daily.   losartan 100 MG tablet Commonly known as: COZAAR Take 100 mg by mouth daily.   metFORMIN 500 MG tablet Commonly known as: GLUCOPHAGE Take 1,000 mg by mouth 2 (two) times daily.   ONE TOUCH ULTRA 2 w/Device Kit   ONE TOUCH ULTRA TEST test strip Generic drug: glucose blood   OneTouch Delica Lancets Fine Misc   oxybutynin 5 MG tablet Commonly known as: DITROPAN 1 tab tid prn frequency,urgency, bladder spasm   Rybelsus 7 MG Tabs Generic drug: Semaglutide Take 1 tablet by mouth daily.   STEGLATRO PO Take by mouth.   VISINE OP Place 1 drop into both eyes daily as needed (dry eyes).       Allergies:  Allergies  Allergen Reactions  . Imitrex [Sumatriptan] Nausea And Vomiting  . Pork-Derived Products Diarrhea    Migraines, cramping   . Acyclovir And Related   . Hydrocodone Itching    Tolerates with benadryl     Family History: Family History  Problem Relation Age of Onset  . Hypertension Mother   . Heart disease Mother   . Diabetes Maternal Grandmother   . Hypertension Maternal Grandmother     Social History:  reports that she has never smoked. She has never used smokeless tobacco. She reports current alcohol use. She reports that she does not use drugs.   Physical Exam: BP 126/79   Pulse 98   Ht 5' 9" (1.753 m)   Wt 256 lb (116.1 kg)   BMI 37.80 kg/m   Constitutional:  Alert and oriented, No acute distress. HEENT: [address] AT, mask in place.  Trachea midline, no  masses. Cardiovascular: No clubbing, cyanosis, or edema. Respiratory: Normal respiratory effort, no increased work of breathing. Skin: No rashes, bruises or suspicious lesions. Neurologic: Grossly intact, no focal deficits, moving all 4 extremities. Psychiatric: Normal mood and affect.  Laboratory Data  Urinalysis Component     Latest Ref Rng & Units 11/29/2019  Specific Gravity, UA     1.005 - 1.030 >1.030 (H)  pH, UA     5.0 - 7.5 5.0  Color, UA     Yellow Orange  Appearance Ur     Clear Cloudy (A)  Leukocytes,UA     Negative Negative  Protein,UA     Negative/Trace Negative  Glucose, UA     Negative 3+ (A)  Ketones, UA     Negative Negative  RBC, UA     Negative 3+ (A)  Bilirubin, UA     Negative Negative  Urobilinogen, Ur     0.2 - 1.0 mg/dL 0.2  Nitrite, UA     Negative Negative  Microscopic Examination  See below:   Component     Latest Ref Rng & Units 11/29/2019  WBC, UA     0 - 5 /hpf 6-10 (A)  RBC     0 - 2 /hpf 3-10 (A)  Epithelial Cells (non renal)     0 - 10 /hpf 0-10  Casts     None seen /lpf Present (A)  Cast Type     N/A Hyaline casts  Mucus, UA     Not Estab. Present (A)  Bacteria, UA     None seen/Few Few     Pertinent Imaging:CLINICAL DATA:  Kidney stone. Patient reports bilateral flank pain.  EXAM: ABDOMEN - 1 VIEW  COMPARISON:  Most recent imaging CT 12/07/2018  FINDINGS: Lower right renal calculi on prior CT are not definitively seen on the current exam, possibly obscured by overlapping stool. No stones over the course of the ureters. No evidence of left nephrolithiasis. There multiple calcifications in the pelvis that are unchanged from prior in typical of phleboliths. Normal bowel gas pattern. Moderate stool in the ascending colon with small volume of stool in the remainder of the colon. No acute osseous abnormalities are seen.  IMPRESSION: Lower right renal calculi on prior CT are not definitively seen on the  current exam, possibly obscured by overlapping stool. No evidence of left urolithiasis or ureteral stones.   Electronically Signed   By: Keith Rake M.D.   On: 11/30/2019 16:09    Assessment & Plan:    1. History of nephrolithiasis/ flank pain - KUB today shows no stones appreciated. I discussed a protocol CT renal stone study for further evaluation. Patient would like to hold off due to medical cost.   - We discussed general stone prevention techniques including drinking plenty water with goal of producing 2.5 L urine daily, increased citric acid intake, avoidance of high oxalate containing foods, and decreased salt intake.  Information about dietary recommendations given today.   She is concerned about her kidney function and uric acid levels, so I will obtain the appropriate blood work.   2. Microscopic hematuria  - UA today shows 3-10 RBCs but patient endorses to starting her menstrual cycle.   No follow-ups on file.  [facility] [address] [place], [place] [address] [phone]  I, Selena Batten, am acting as a scribe for [employer],  I have reviewed the above documentation for accuracy and completeness, and I agree with the above.    Zara Council, PA-C

## 2019-11-29 ENCOUNTER — Ambulatory Visit (INDEPENDENT_AMBULATORY_CARE_PROVIDER_SITE_OTHER): Payer: Managed Care, Other (non HMO) | Admitting: Urology

## 2019-11-29 ENCOUNTER — Ambulatory Visit
Admission: RE | Admit: 2019-11-29 | Discharge: 2019-11-29 | Disposition: A | Discharge status: Home / Self Care | Payer: Managed Care, Other (non HMO) | Source: Ambulatory Visit | Attending: Urology | Admitting: Urology

## 2019-11-29 ENCOUNTER — Other Ambulatory Visit: Payer: Self-pay

## 2019-11-29 ENCOUNTER — Encounter: Payer: Self-pay | Admitting: Urology

## 2019-11-29 ENCOUNTER — Ambulatory Visit
Admission: RE | Admit: 2019-11-29 | Discharge: 2019-11-29 | Disposition: A | Discharge status: Home / Self Care | Payer: Managed Care, Other (non HMO) | Attending: Urology | Admitting: Urology

## 2019-11-29 VITALS — BP 126/79 | HR 98 | Ht 69.0 in | Wt 256.0 lb

## 2019-11-29 DIAGNOSIS — Z87442 Personal history of urinary calculi: Secondary | ICD-10-CM

## 2019-11-29 DIAGNOSIS — R3129 Other microscopic hematuria: Secondary | ICD-10-CM

## 2019-11-29 DIAGNOSIS — N200 Calculus of kidney: Secondary | ICD-10-CM | POA: Diagnosis not present

## 2019-11-30 ENCOUNTER — Telehealth: Payer: Self-pay | Admitting: Family Medicine

## 2019-11-30 LAB — MICROSCOPIC EXAMINATION

## 2019-11-30 LAB — BASIC METABOLIC PANEL
BUN/Creatinine Ratio: 11 (ref 9–23)
BUN: 11 mg/dL (ref 6–20)
CO2: 23 mmol/L (ref 20–29)
Calcium: 9.6 mg/dL (ref 8.7–10.2)
Chloride: 99 mmol/L (ref 96–106)
Creatinine, Ser: 0.98 mg/dL (ref 0.57–1.00)
GFR calc Af Amer: 85 mL/min/{1.73_m2} (ref >59)
GFR calc non Af Amer: 73 mL/min/{1.73_m2} (ref >59)
Glucose: 84 mg/dL (ref 65–99)
Potassium: 4.2 mmol/L (ref 3.5–5.2)
Sodium: 135 mmol/L (ref 134–144)

## 2019-11-30 LAB — URINALYSIS, COMPLETE
Bilirubin, UA: NEGATIVE
Ketones, UA: NEGATIVE
Leukocytes,UA: NEGATIVE
Nitrite, UA: NEGATIVE
Protein,UA: NEGATIVE
Specific Gravity, UA: >1.03 — ABNORMAL HIGH (ref 1.005–1.030)
Urobilinogen, Ur: 0.2 mg/dL (ref 0.2–1.0)
pH, UA: 5 (ref 5.0–7.5)

## 2019-11-30 LAB — URIC ACID: Uric Acid: 3.8 mg/dL (ref 2.6–6.2)

## 2019-11-30 NOTE — Telephone Encounter (Signed)
Patient called the office today regarding her urine, blood test, and xray results.  She received them on MyChart.   She states that she is still experiencing intermittent pain on her right and left sides of her lower abdomen.  She is concerned about where the pain is coming from.    Please contact patient at [phone] to advise next steps.

## 2019-11-30 NOTE — Telephone Encounter (Signed)
-----   Message from Harle Battiest, PA-C sent at 11/30/2019 10:20 AM EDT ----- Please let Mrs. Berk know that her kidney function is normal.

## 2019-11-30 NOTE — Telephone Encounter (Signed)
Patient notified and voiced understanding.

## 2019-12-05 NOTE — Telephone Encounter (Signed)
Patient notified and took laxatives and is feeling better. She will contact our office if the pain comes back for a X Ray.

## 2020-08-04 IMAGING — CT CT RENAL STONE PROTOCOL
1 of 2 series · 15 of 32 positions shown, 19 images · non-contrast
Comparison: 05/18/2017

CLINICAL DATA: Right flank pain with nausea and vomiting for 6
days. Nephrolithiasis.

EXAM:
CT ABDOMEN AND PELVIS WITHOUT CONTRAST
TECHNIQUE: Multidetector CT imaging of the abdomen and pelvis was performed
following the standard protocol without IV contrast.

[Series 2: axial st · axial · 0.84mm/px · z∈[-943,-488]mm · 15 of 101 slices shown, 19 images]
[im 5/101  soft-tissue]
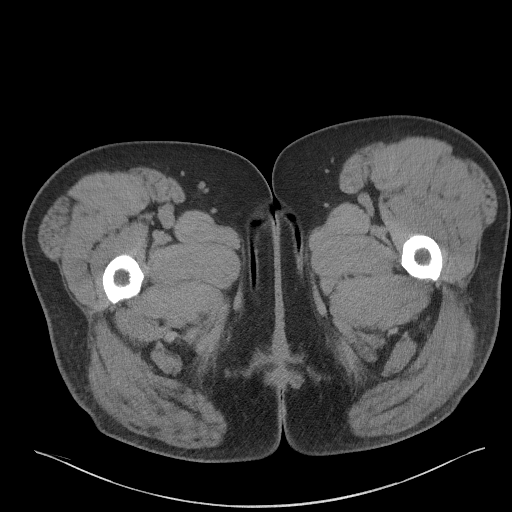
[im 5/101  bone]
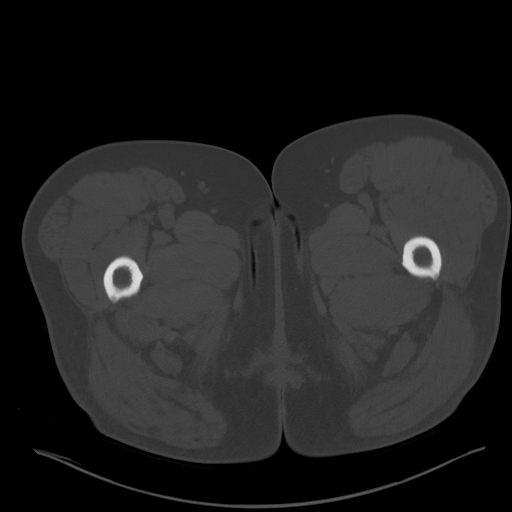
[im 13/101  soft-tissue]
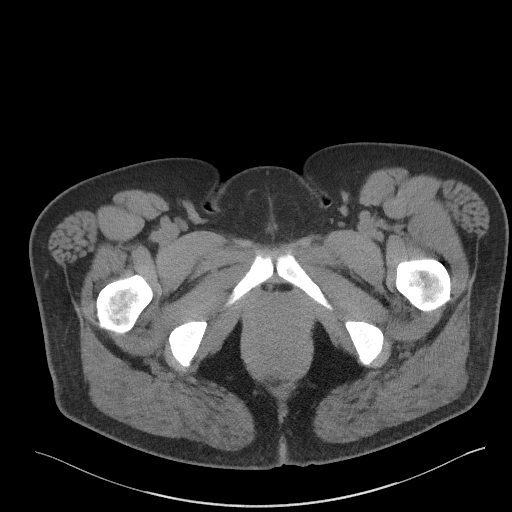
[im 21/101  soft-tissue]
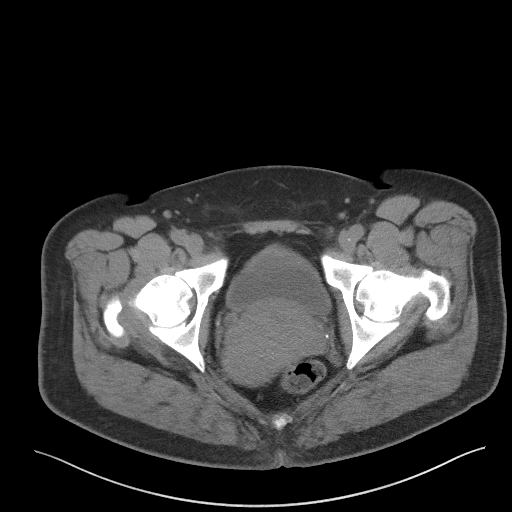
[im 30/101  soft-tissue]
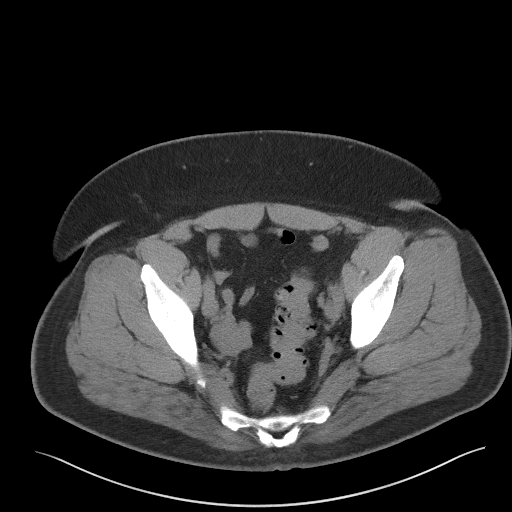
[im 34/101  soft-tissue]
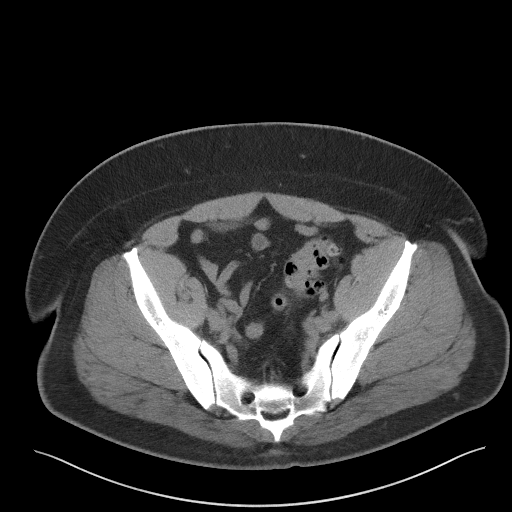
[im 42/101  soft-tissue]
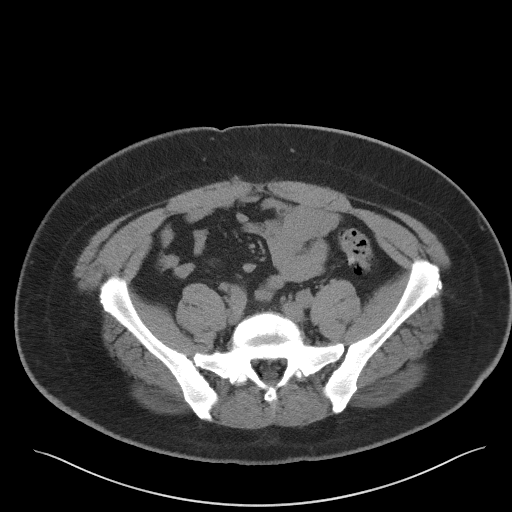
[im 51/101  soft-tissue]
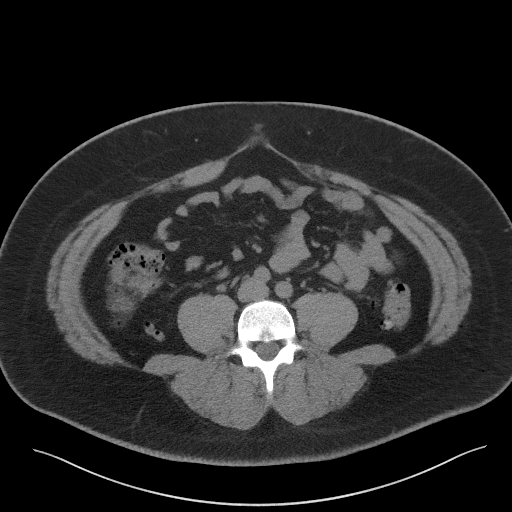
[im 59/101  soft-tissue]
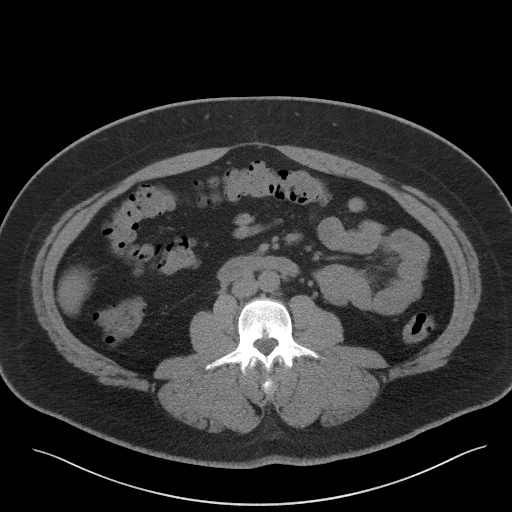
[im 67/101  soft-tissue]
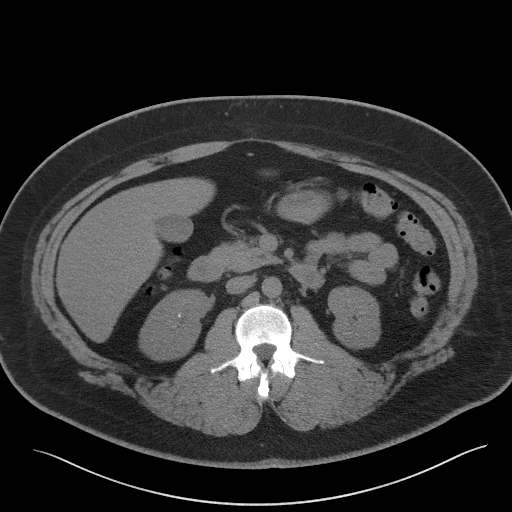
[im 67/101  bone]
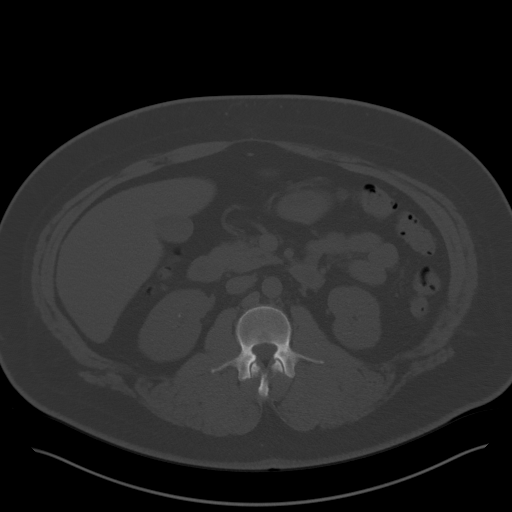
[im 71/101  soft-tissue]
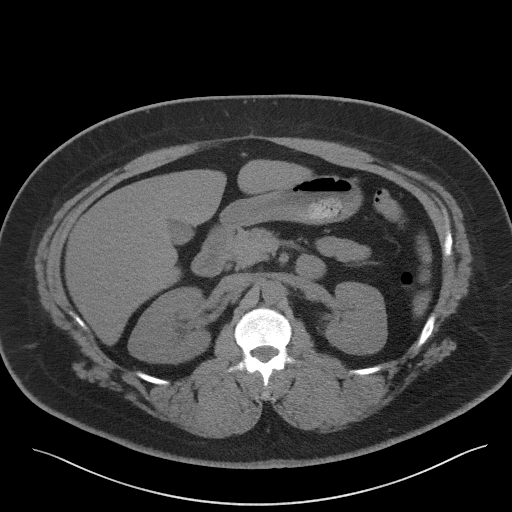
[im 80/101  soft-tissue]
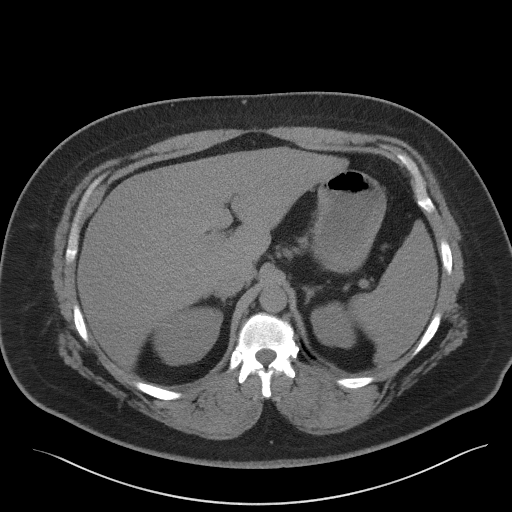
[im 84/101  lung]
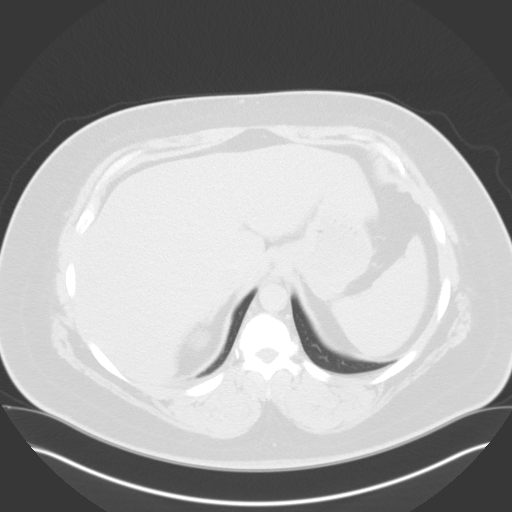
[im 88/101  soft-tissue]
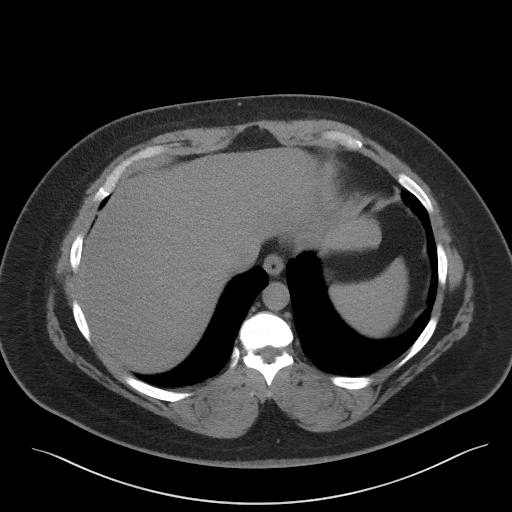
[im 88/101  lung]
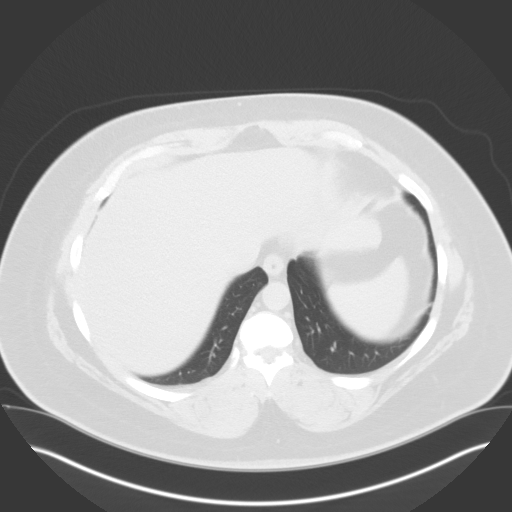
[im 92/101  lung]
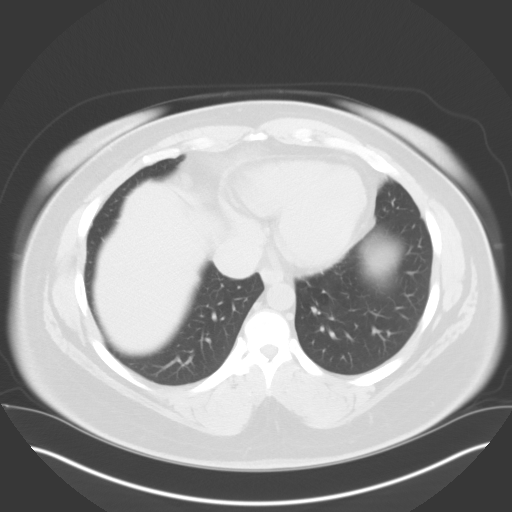
[im 96/101  soft-tissue]
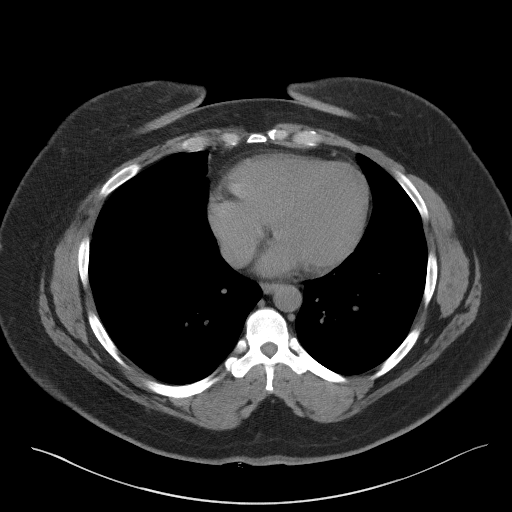
[im 96/101  lung]
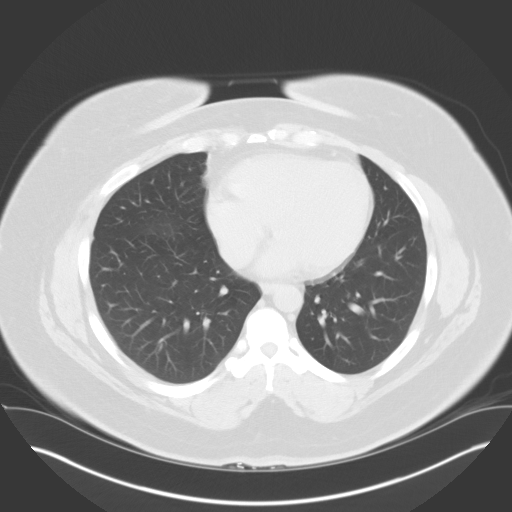

[15 of 32 positions shown; findings below may reference images not displayed]

FINDINGS: Lower chest: No acute findings.

Hepatobiliary: No mass visualized on this unenhanced exam.
Gallbladder is unremarkable. No evidence of biliary ductal
dilatation.

Pancreas: No mass or inflammatory process visualized on this
unenhanced exam.

Spleen:  Within normal limits in size.

Adrenals/Urinary tract: Several tiny calculi are seen in the lower
pole the right kidney measuring up to 3 mm. No evidence of ureteral
calculi or hydronephrosis. Unremarkable unopacified urinary bladder.

Stomach/Bowel: No evidence of obstruction, inflammatory process, or
abnormal fluid collections. Normal appendix visualized.
Diverticulosis is seen mainly involving the descending and sigmoid
colon, however there is no evidence of diverticulitis.

Vascular/Lymphatic: No pathologically enlarged lymph nodes
identified. No evidence of abdominal aortic aneurysm.

Reproductive: Uterus is retroflexed. 3.4 cm soft tissue density is
seen in the right adnexa which contains a small focus of. This is
suspicious for a small ovarian dermoid. No evidence of inflammatory
process or abnormal fluid collections.

Other:  None.

Musculoskeletal:  No suspicious bone lesions identified.
IMPRESSION: Tiny nonobstructing right renal calculi. No evidence of ureteral
calculi, hydronephrosis, or other acute findings.

Colonic diverticulosis, without radiographic evidence of
diverticulitis.

Suspect small right ovarian dermoid. Recommend pelvis MRI without
and with contrast for further evaluation.

## 2021-08-02 ENCOUNTER — Other Ambulatory Visit: Payer: Self-pay | Admitting: Family Medicine

## 2021-08-02 ENCOUNTER — Ambulatory Visit
Admission: RE | Admit: 2021-08-02 | Discharge: 2021-08-02 | Disposition: A | Discharge status: Home / Self Care | Payer: Managed Care, Other (non HMO) | Source: Ambulatory Visit | Attending: Family Medicine | Admitting: Family Medicine

## 2021-08-02 DIAGNOSIS — R52 Pain, unspecified: Secondary | ICD-10-CM

## 2021-08-02 MED ORDER — IOPAMIDOL (ISOVUE-300) INJECTION 61%
100.0000 mL | Freq: Once | INTRAVENOUS | Status: AC | PRN
  Administered 2021-08-02: 100 mL via INTRAVENOUS

## 2021-09-30 ENCOUNTER — Other Ambulatory Visit: Payer: Self-pay | Admitting: Family Medicine

## 2021-09-30 DIAGNOSIS — Z1231 Encounter for screening mammogram for malignant neoplasm of breast: Secondary | ICD-10-CM

## 2021-10-28 ENCOUNTER — Ambulatory Visit
Admission: RE | Admit: 2021-10-28 | Discharge: 2021-10-28 | Disposition: A | Discharge status: Home / Self Care | Payer: Managed Care, Other (non HMO) | Source: Ambulatory Visit | Attending: Family Medicine | Admitting: Family Medicine

## 2021-10-28 DIAGNOSIS — Z1231 Encounter for screening mammogram for malignant neoplasm of breast: Secondary | ICD-10-CM

## 2021-10-31 ENCOUNTER — Other Ambulatory Visit: Payer: Self-pay | Admitting: Internal Medicine

## 2021-10-31 DIAGNOSIS — R1010 Upper abdominal pain, unspecified: Secondary | ICD-10-CM

## 2021-10-31 DIAGNOSIS — R1013 Epigastric pain: Secondary | ICD-10-CM

## 2021-11-05 ENCOUNTER — Ambulatory Visit
Admission: RE | Admit: 2021-11-05 | Discharge: 2021-11-05 | Discharge status: Home / Self Care | Payer: Managed Care, Other (non HMO) | Source: Ambulatory Visit | Attending: Internal Medicine

## 2021-11-05 DIAGNOSIS — R1010 Upper abdominal pain, unspecified: Secondary | ICD-10-CM

## 2021-11-05 DIAGNOSIS — R1013 Epigastric pain: Secondary | ICD-10-CM

## 2021-12-12 ENCOUNTER — Other Ambulatory Visit: Payer: Self-pay | Admitting: Internal Medicine

## 2021-12-12 DIAGNOSIS — R1013 Epigastric pain: Secondary | ICD-10-CM

## 2021-12-12 DIAGNOSIS — K760 Fatty (change of) liver, not elsewhere classified: Secondary | ICD-10-CM

## 2021-12-12 DIAGNOSIS — R1010 Upper abdominal pain, unspecified: Secondary | ICD-10-CM

## 2021-12-12 DIAGNOSIS — R194 Change in bowel habit: Secondary | ICD-10-CM

## 2021-12-16 ENCOUNTER — Ambulatory Visit
Admission: RE | Admit: 2021-12-16 | Discharge: 2021-12-16 | Disposition: A | Discharge status: Home / Self Care | Payer: Managed Care, Other (non HMO) | Source: Ambulatory Visit | Attending: Internal Medicine | Admitting: Internal Medicine

## 2021-12-16 DIAGNOSIS — R1010 Upper abdominal pain, unspecified: Secondary | ICD-10-CM | POA: Diagnosis present

## 2021-12-16 DIAGNOSIS — K760 Fatty (change of) liver, not elsewhere classified: Secondary | ICD-10-CM | POA: Diagnosis present

## 2021-12-16 DIAGNOSIS — R194 Change in bowel habit: Secondary | ICD-10-CM | POA: Insufficient documentation

## 2021-12-16 DIAGNOSIS — R1013 Epigastric pain: Secondary | ICD-10-CM | POA: Insufficient documentation

## 2022-02-14 ENCOUNTER — Ambulatory Visit: Payer: Managed Care, Other (non HMO)

## 2022-02-14 ENCOUNTER — Ambulatory Visit (INDEPENDENT_AMBULATORY_CARE_PROVIDER_SITE_OTHER): Payer: Managed Care, Other (non HMO) | Admitting: Podiatry

## 2022-02-14 DIAGNOSIS — M2042 Other hammer toe(s) (acquired), left foot: Secondary | ICD-10-CM | POA: Diagnosis not present

## 2022-02-14 DIAGNOSIS — E119 Type 2 diabetes mellitus without complications: Secondary | ICD-10-CM | POA: Diagnosis not present

## 2022-02-14 DIAGNOSIS — M2041 Other hammer toe(s) (acquired), right foot: Secondary | ICD-10-CM

## 2022-02-14 DIAGNOSIS — R52 Pain, unspecified: Secondary | ICD-10-CM

## 2022-02-14 NOTE — Progress Notes (Signed)
Chief Complaint  Patient presents with   Foot Problem    Pain located in the right foot, 5th digit, tingling, pain has become worse over the last few months     HPI: 41 y.o. female presenting today as a new patient for evaluation of pain and tenderness associated to the fifth digits bilateral.  Pain has been ongoing for several years.  She developed symptomatic calluses over the fifth toes as well.  She has tried multiple padding and corn pads, splinting, shoe gear modifications but unfortunately there has been no relief of symptoms for the patient.  She presents for further treatment evaluation  Past Medical History:  Diagnosis Date   Diabetes mellitus without complication (HCC)    Genital HSV    History of kidney stones    Hypertension    elevated with pregnancy (eclampsia) only   Migraines    once every 2 weeks or so. excedrin migraine helps   Osgood-Schlatter's disease of knees, bilateral    HAS HAD SINCE AGE OF 17   Past Surgical History:  Procedure Laterality Date   ADENOIDECTOMY     CYSTOSCOPY/URETEROSCOPY/HOLMIUM LASER/STENT PLACEMENT Right 07/17/2017   Procedure: CYSTOSCOPY/URETEROSCOPY/HOLMIUM LASER/STENT PLACEMENT;  Surgeon: Riki Altes, MD;  Location: [facility] ORS;  Service: Urology;  Laterality: Right;   TONSILLECTOMY     TONSILLECTOMY  1990   WISDOM TOOTH EXTRACTION     Allergies  Allergen Reactions   Imitrex [Sumatriptan] Nausea And Vomiting   Pork-Derived Products Diarrhea    Migraines, cramping    Acyclovir And Related    Hydrocodone Itching    Tolerates with benadryl      Objective: Physical Exam General: The patient is alert and oriented x3 in no acute distress.  Dermatology: Skin is cool, dry and supple bilateral lower extremities.  Hyperkeratotic calluses overlying the PIPJ of the fifth digits bilateral with associated tenderness  Vascular: Palpable pedal pulses bilaterally. No edema or erythema noted. Capillary refill within normal  limits.  Neurological: Grossly intact via light touch  Musculoskeletal Exam: All pedal and ankle joints range of motion within normal limits bilateral. Muscle strength 5/5 in all groups bilateral. Hammertoe contracture deformity noted to the fifth digits bilateral  Radiographic Exam B/L feet 02/14/2022: Hammertoe contracture deformity noted to the interphalangeal joints fifth digits consistent with hammertoe deformity mentioned on clinical musculoskeletal exam.     Assessment/plan of care: 1.  Hammertoes fifth digits bilateral with overlying symptomatic corn/calluses -Patient evaluated. X-Rays reviewed.  Comprehensive diabetic foot exam performed today -Unfortunately patient has had pain and tenderness associated to these areas for several years without any relief of symptoms despite shoe gear modifications, padding, and splinting.  She routinely has the calluses debrided which only provided minimal temporary relief.  I do believe surgery is warranted at this time -Today we discussed hammertoe arthroplasty surgery of the fifth digits bilateral.  Risk benefits advantages and disadvantages were all explained in detail.  No guarantees were expressed or implied.  All patient questions were answered.  Postoperative recovery course was also explained in detail.  She knows that she will be minimally weightbearing as tolerated in postsurgical shoes bilateral for about 3 weeks -Authorization for surgery was initiated today.  Surgery will consist of PIPJ arthroplasty with derotational skin plasty fifth digits bilateral -Return to clinic 1 week postop  *Works from home for [employer], DPM [facility]  Dr. Felecia Shelling, DPM    [address]  [place], [place] [address]                Office [phone]  Fax [phone]

## 2022-02-18 ENCOUNTER — Telehealth: Payer: Self-pay

## 2022-02-18 NOTE — Telephone Encounter (Signed)
Received surgery paperwork from the [place] office. Spoke to [place] to schedule surgery with Dr. Amalia Hailey. She stated she is waiting to get her new insurance info and will need a quote. I told her to call me once she get her insurance info and I will get the quote for her.

## 2022-02-18 NOTE — Telephone Encounter (Signed)
She stated when she saw you Friday you mentioned a cream that you would call into her pharmacy. Will you please send that?

## 2022-02-19 ENCOUNTER — Other Ambulatory Visit: Payer: Self-pay | Admitting: Podiatry

## 2022-02-19 MED ORDER — CLOTRIMAZOLE-BETAMETHASONE 1-0.05 % EX CREA: 1.0000 | TOPICAL_CREAM | Freq: Two times a day (BID) | CUTANEOUS | 1 refills | Status: AC

## 2022-02-19 NOTE — Telephone Encounter (Signed)
Ummm not sure what cream was discussed. Ammie could you please reach out to this patient and see maybe what the cream was indicated for?  I just tried calling her and she did not answer and there was no voicemail available.  Thanks, Dr. Amalia Hailey

## 2022-02-19 NOTE — Telephone Encounter (Signed)
My fault. Rx Lotrisone Cream sent to pharmacy. - Dr. Amalia Hailey

## 2022-02-20 ENCOUNTER — Telehealth: Payer: Self-pay | Admitting: Podiatry

## 2022-02-20 NOTE — Telephone Encounter (Signed)
DOS: 03/06/2022  [employer] Effective 02/17/2022  Hammertoe Repair 5th B/L ([id number])  Deductible: $1,300 with $0 met Out-of-Pocket: $3,200 with $0 met CoInsurance: 20%  Pending Authorization #: [id number]

## 2022-02-21 ENCOUNTER — Telehealth: Payer: Self-pay

## 2022-02-21 NOTE — Telephone Encounter (Signed)
Niyonna called to cancel her surgery with Dr. Amalia Hailey on 03/06/2022. She stated she can't get the time off work right now. Notified Dr. Amalia Hailey and Caren Griffins with [facility]

## 2022-02-24 ENCOUNTER — Telehealth: Payer: Self-pay | Admitting: *Deleted

## 2022-02-24 NOTE — Telephone Encounter (Signed)
error 

## 2022-03-17 ENCOUNTER — Encounter: Payer: 59 | Admitting: Podiatry

## 2022-03-25 ENCOUNTER — Encounter: Payer: 59 | Admitting: Podiatry

## 2022-04-08 ENCOUNTER — Encounter: Payer: 59 | Admitting: Podiatry

## 2022-10-02 ENCOUNTER — Other Ambulatory Visit: Payer: Self-pay | Admitting: Podiatry

## 2022-10-02 DIAGNOSIS — M2041 Other hammer toe(s) (acquired), right foot: Secondary | ICD-10-CM

## 2022-10-02 DIAGNOSIS — E119 Type 2 diabetes mellitus without complications: Secondary | ICD-10-CM

## 2022-10-02 DIAGNOSIS — R52 Pain, unspecified: Secondary | ICD-10-CM

## 2022-10-06 ENCOUNTER — Other Ambulatory Visit: Payer: Self-pay | Admitting: Obstetrics and Gynecology

## 2022-10-06 DIAGNOSIS — Z1231 Encounter for screening mammogram for malignant neoplasm of breast: Secondary | ICD-10-CM

## 2022-10-31 ENCOUNTER — Ambulatory Visit
Admission: RE | Admit: 2022-10-31 | Discharge: 2022-10-31 | Disposition: A | Discharge status: Home / Self Care | Payer: 59 | Source: Ambulatory Visit | Attending: Obstetrics and Gynecology

## 2022-10-31 DIAGNOSIS — Z1231 Encounter for screening mammogram for malignant neoplasm of breast: Secondary | ICD-10-CM

## 2023-06-09 ENCOUNTER — Ambulatory Visit: Payer: 59 | Attending: Obstetrics and Gynecology | Admitting: Physical Therapy

## 2023-06-09 ENCOUNTER — Other Ambulatory Visit: Payer: Self-pay

## 2023-06-09 ENCOUNTER — Encounter: Payer: Self-pay | Admitting: Physical Therapy

## 2023-06-09 DIAGNOSIS — R278 Other lack of coordination: Secondary | ICD-10-CM | POA: Insufficient documentation

## 2023-06-09 DIAGNOSIS — M6281 Muscle weakness (generalized): Secondary | ICD-10-CM | POA: Insufficient documentation
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
## 2023-06-25 ENCOUNTER — Ambulatory Visit: Attending: Obstetrics and Gynecology | Admitting: Physical Therapy

## 2023-06-25 ENCOUNTER — Encounter: Payer: Self-pay | Admitting: Physical Therapy

## 2023-06-25 DIAGNOSIS — M6281 Muscle weakness (generalized): Secondary | ICD-10-CM

## 2023-06-25 DIAGNOSIS — R278 Other lack of coordination: Secondary | ICD-10-CM | POA: Diagnosis present
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
## 2023-06-30 ENCOUNTER — Ambulatory Visit (INDEPENDENT_AMBULATORY_CARE_PROVIDER_SITE_OTHER)

## 2023-06-30 ENCOUNTER — Encounter: Payer: Self-pay | Admitting: Podiatry

## 2023-06-30 ENCOUNTER — Ambulatory Visit (INDEPENDENT_AMBULATORY_CARE_PROVIDER_SITE_OTHER): Admitting: Podiatry

## 2023-06-30 DIAGNOSIS — L989 Disorder of the skin and subcutaneous tissue, unspecified: Secondary | ICD-10-CM

## 2023-06-30 DIAGNOSIS — M722 Plantar fascial fibromatosis: Secondary | ICD-10-CM

## 2023-06-30 MED ORDER — TRIAMCINOLONE ACETONIDE 0.5 % EX OINT: 1.0000 | TOPICAL_OINTMENT | Freq: Two times a day (BID) | CUTANEOUS | 1 refills | Status: DC

## 2023-06-30 NOTE — Progress Notes (Signed)
 Chief Complaint  Patient presents with   Foot Pain    "I had a cut years ago on the bottom of my foot.  I went for a pedicure and they filed the skin off.  Now, there's a sore spot." N - sore spot L - heel pain right D - 2.5 weeks O - suddenly C - tender, sore, turns white A - walking or standing long, pressure T - Tylenol , scrape the skin    HPI: 43 y.o. female presenting today for evaluation of a skin lesion to the plantar aspect of the right heel.  Onset about 2 months ago when she had an exfoliating wrap on her foot.  During that time there was an open wound to the heel.  She states that after the exfoliating wrap she had inflammation and a recurrent callus that developed which is symptomatic.  Past Medical History:  Diagnosis Date   Diabetes mellitus without complication (HCC)    Genital HSV    History of kidney stones    Hypertension    elevated with pregnancy (eclampsia) only   Migraines    once every 2 weeks or so. excedrin migraine helps   Osgood-Schlatter's disease of knees, bilateral    HAS HAD SINCE AGE OF 17    Past Surgical History:  Procedure Laterality Date   ADENOIDECTOMY     CYSTOSCOPY/URETEROSCOPY/HOLMIUM LASER/STENT PLACEMENT Right 07/17/2017   Procedure: CYSTOSCOPY/URETEROSCOPY/HOLMIUM LASER/STENT PLACEMENT;  Surgeon: Geraline Knapp, MD;  Location: [facility];  Service: Urology;  Laterality: Right;   TONSILLECTOMY     TONSILLECTOMY  1990   WISDOM TOOTH EXTRACTION      Allergies  Allergen Reactions   Imitrex [Sumatriptan] Nausea And Vomiting   Pork-Derived Products Diarrhea    Migraines, cramping    Acyclovir And Related    Hydrocodone  Itching    Tolerates with benadryl       Physical Exam: General: The patient is alert and oriented x3 in no acute distress.  Dermatology: Skin is warm, dry and supple bilateral lower extremities.   Vascular: Palpable pedal pulses bilaterally. Capillary refill within normal limits.  No appreciable edema.  No  erythema.  Neurological: Grossly intact via light touch  Musculoskeletal Exam: No pedal deformities noted.  There is a raised lesion to the plantar aspect of the heel.  No open wound and it appears somewhat firm compared to the surrounding soft tissue.  It is very superficial just below the dermal layers.  Associated tenderness  Assessment/Plan of Care: 1.  Raised soft tissue lesion plantar aspect of the right heel without open wound  -Patient evaluated -Explained to the patient I am unsure of the pathology associated to the raised lesion to the plantar heel.  There is no open wound and clinically no indication or concern for infection. -Prescription for Kenalog 0.1% ointment applied twice daily -Maintain good foot hygiene and recommend good supportive tennis shoes and sneakers -Return to clinic 4 weeks       Dot Gazella, DPM [facility]  Dr. Dot Gazella, DPM    [address] [place], [place] [address]                Office [phone]  Fax [phone]

## 2023-07-08 ENCOUNTER — Ambulatory Visit: Payer: Self-pay | Admitting: Physical Therapy

## 2023-07-24 ENCOUNTER — Other Ambulatory Visit: Payer: Self-pay | Admitting: Podiatry

## 2023-07-27 ENCOUNTER — Ambulatory Visit: Attending: Obstetrics and Gynecology | Admitting: Physical Therapy

## 2023-07-27 ENCOUNTER — Encounter: Payer: Self-pay | Admitting: Physical Therapy

## 2023-07-27 DIAGNOSIS — R278 Other lack of coordination: Secondary | ICD-10-CM | POA: Insufficient documentation

## 2023-07-27 DIAGNOSIS — M6281 Muscle weakness (generalized): Secondary | ICD-10-CM | POA: Insufficient documentation
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
## 2023-07-28 ENCOUNTER — Encounter: Payer: Self-pay | Admitting: Podiatry

## 2023-07-28 ENCOUNTER — Ambulatory Visit (INDEPENDENT_AMBULATORY_CARE_PROVIDER_SITE_OTHER): Admitting: Podiatry

## 2023-07-28 VITALS — Ht 69.0 in | Wt 256.0 lb

## 2023-07-28 DIAGNOSIS — L309 Dermatitis, unspecified: Secondary | ICD-10-CM

## 2023-07-28 NOTE — Progress Notes (Signed)
   Chief Complaint  Patient presents with   Wound Check    Pt is here to f/u on right foot due to skin lesion, pt states area is still tender, has been wearing softer shoes as recommended she states that elevate the pain some.    HPI: 43 y.o. female presenting today for follow-up evaluation of a skin lesion to the plantar aspect of the right heel.    Brief history: Onset about 2 months ago when she had an exfoliating wrap on her foot.  During that time there was an open wound to the heel.  She states that after the exfoliating wrap she had inflammation and a recurrent callus that developed which is symptomatic.  Past Medical History:  Diagnosis Date   Diabetes mellitus without complication (HCC)    Genital HSV    History of kidney stones    Hypertension    elevated with pregnancy (eclampsia) only   Migraines    once every 2 weeks or so. excedrin migraine helps   Osgood-Schlatter's disease of knees, bilateral    HAS HAD SINCE AGE OF 17    Past Surgical History:  Procedure Laterality Date   ADENOIDECTOMY     CYSTOSCOPY/URETEROSCOPY/HOLMIUM LASER/STENT PLACEMENT Right 07/17/2017   Procedure: CYSTOSCOPY/URETEROSCOPY/HOLMIUM LASER/STENT PLACEMENT;  Surgeon: Twylla Glendia BROCKS, MD;  Location: [facility];  Service: Urology;  Laterality: Right;   TONSILLECTOMY     TONSILLECTOMY  1990   WISDOM TOOTH EXTRACTION      Allergies  Allergen Reactions   Imitrex [Sumatriptan] Nausea And Vomiting   Pork-Derived Products Diarrhea    Migraines, cramping    Acyclovir And Related    Hydrocodone  Itching    Tolerates with benadryl       Physical Exam: General: The patient is alert and oriented x3 in no acute distress.  Dermatology: Skin is warm, dry and supple bilateral lower extremities.   Vascular: Palpable pedal pulses bilaterally. Capillary refill within normal limits.  No appreciable edema.  No erythema.  Neurological: Grossly intact via light touch  Musculoskeletal Exam: No pedal  deformities noted.  Overall significant improvement.  There is a raised lesion to the plantar aspect of the heel.  No open wound and it appears somewhat firm compared to the surrounding soft tissue.  It is very superficial just below the dermal layers.  No associated tenderness  Assessment/Plan of Care: 1.  Raised soft tissue lesion plantar aspect of the right heel without open wound  -Patient evaluated - The pain has essentially resolved.  Continue Kenalog  0.1% ointment twice daily until the lesion dissipates -Continue wearing good supportive tennis shoes and sneakers -Return to clinic PRN      Thresa EMERSON Sar, DPM [facility]  Dr. Thresa EMERSON Sar, DPM    [address] [place], [place] [address]                Office [phone]  Fax [phone]

## 2023-08-03 ENCOUNTER — Encounter: Payer: Self-pay | Admitting: Physical Therapy

## 2023-08-03 ENCOUNTER — Ambulatory Visit: Admitting: Physical Therapy

## 2023-08-03 DIAGNOSIS — M6281 Muscle weakness (generalized): Secondary | ICD-10-CM | POA: Diagnosis not present

## 2023-08-03 DIAGNOSIS — R278 Other lack of coordination: Secondary | ICD-10-CM
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
## 2023-08-24 ENCOUNTER — Ambulatory Visit: Admitting: Physical Therapy

## 2023-08-31 ENCOUNTER — Ambulatory Visit: Admitting: Physical Therapy

## 2023-09-07 ENCOUNTER — Telehealth: Payer: Self-pay | Admitting: Physical Therapy

## 2023-09-07 ENCOUNTER — Ambulatory Visit: Attending: Obstetrics and Gynecology | Admitting: Physical Therapy

## 2023-09-07 DIAGNOSIS — M6281 Muscle weakness (generalized): Secondary | ICD-10-CM | POA: Insufficient documentation

## 2023-09-07 DIAGNOSIS — R278 Other lack of coordination: Secondary | ICD-10-CM | POA: Insufficient documentation

## 2023-09-07 NOTE — Telephone Encounter (Signed)
 Left patient a VM due to no show this morning for PT appt

## 2023-09-22 ENCOUNTER — Telehealth: Payer: Self-pay | Admitting: Podiatry

## 2023-09-22 NOTE — Telephone Encounter (Signed)
 Pt left message with billing wanting to know estimate for a surgery and had questions about sitting and standing after the surgery.  I called pt and she was scheduled for surgery back in jan 2024 and it was for hammertoe procedure.  I talked with pt and she asked for the cpt code so she could call insurance and check cost.  I did explain that in order to schedule surgery pt would have to have a new surgical consult as the previous one she signed was valid for only 6 months and she said she was aware but thank you I gave her the [id number] cpt for hammertoe repair surgery that she was originally scheduled for in 2024

## 2023-11-25 ENCOUNTER — Other Ambulatory Visit: Payer: Self-pay | Admitting: Obstetrics and Gynecology

## 2023-11-25 DIAGNOSIS — Z1231 Encounter for screening mammogram for malignant neoplasm of breast: Secondary | ICD-10-CM

## 2023-12-17 ENCOUNTER — Ambulatory Visit
Admission: RE | Admit: 2023-12-17 | Discharge: 2023-12-17 | Disposition: A | Source: Ambulatory Visit | Attending: Obstetrics and Gynecology | Admitting: Obstetrics and Gynecology

## 2023-12-17 DIAGNOSIS — Z1231 Encounter for screening mammogram for malignant neoplasm of breast: Secondary | ICD-10-CM
